# Patient Record
Sex: Female | Born: 1995 | Race: Asian | Hispanic: No | Marital: Married | Smoking: Never smoker
Health system: Southern US, Community
[De-identification: ages and names within clinical notes are randomized; demographics above are authoritative.]

## PROBLEM LIST (undated history)

## (undated) ENCOUNTER — Inpatient Hospital Stay (HOSPITAL_COMMUNITY): Payer: Self-pay

---

## 2017-01-10 ENCOUNTER — Ambulatory Visit (INDEPENDENT_AMBULATORY_CARE_PROVIDER_SITE_OTHER): Payer: Self-pay | Admitting: General Practice

## 2017-01-10 ENCOUNTER — Encounter: Payer: Self-pay | Admitting: General Practice

## 2017-01-10 DIAGNOSIS — Z3201 Encounter for pregnancy test, result positive: Secondary | ICD-10-CM

## 2017-01-10 LAB — POCT PREGNANCY, URINE: PREG TEST UR: POSITIVE — AB

## 2017-01-10 NOTE — Progress Notes (Signed)
Patient here for UPT today. UPT +. Patient reports first positive home test June 2018. LMP 10/31/16 EDD 08/07/17 10.1 weeks. Patient informed. Patient states she only takes prenatal vitamins. Encouraged patient to start care. Patient had no questions

## 2017-01-31 ENCOUNTER — Encounter: Payer: Self-pay | Admitting: Obstetrics

## 2017-02-01 ENCOUNTER — Encounter: Payer: Self-pay | Admitting: Certified Nurse Midwife

## 2017-02-01 ENCOUNTER — Ambulatory Visit (INDEPENDENT_AMBULATORY_CARE_PROVIDER_SITE_OTHER): Payer: Medicaid Other | Admitting: Certified Nurse Midwife

## 2017-02-01 ENCOUNTER — Other Ambulatory Visit (HOSPITAL_COMMUNITY)
Admission: RE | Admit: 2017-02-01 | Discharge: 2017-02-01 | Disposition: A | Discharge status: Home / Self Care | Payer: Medicaid Other | Source: Ambulatory Visit | Attending: Certified Nurse Midwife | Admitting: Certified Nurse Midwife

## 2017-02-01 VITALS — BP 114/70 | HR 80 | Ht 61.0 in | Wt 125.8 lb

## 2017-02-01 DIAGNOSIS — Z3400 Encounter for supervision of normal first pregnancy, unspecified trimester: Secondary | ICD-10-CM | POA: Insufficient documentation

## 2017-02-01 DIAGNOSIS — Z3402 Encounter for supervision of normal first pregnancy, second trimester: Secondary | ICD-10-CM | POA: Diagnosis not present

## 2017-02-01 NOTE — Progress Notes (Signed)
Subjective:    Brandi Jennings is being seen today for her first obstetrical visit.  This is a planned pregnancy. She is at 3758w3d gestation. Her obstetrical history is significant for from [place]. Relationship with FOB: spouse, living together. Patient does intend to breast feed. Pregnancy history fully reviewed.  The information documented in the HPI was reviewed and verified.  Menstrual History: OB History    Gravida Para Term Preterm AB Living   1             SAB TAB Ectopic Multiple Live Births                   Patient's last menstrual period was 11/06/2016.    History reviewed. No pertinent past medical history.  History reviewed. No pertinent surgical history.   (Not in a hospital admission) Allergies  Allergen Reactions  . Shrimp [Shellfish Allergy] Itching and Rash    Social History  Substance Use Topics  . Smoking status: Never Smoker  . Smokeless tobacco: Never Used  . Alcohol use No    Family History  Problem Relation Age of Onset  . Hypertension Mother      Review of Systems Constitutional: negative for weight loss Gastrointestinal: negative for vomiting Genitourinary:negative for genital lesions and vaginal discharge and dysuria Musculoskeletal:negative for back pain Behavioral/Psych: negative for abusive relationship, depression, illegal drug usage and tobacco use    Objective:    BP 114/70   Pulse 80   Ht 5\' 1"  (1.549 m)   Wt 125 lb 12.8 oz (57.1 kg)   LMP 11/06/2016   BMI 23.77 kg/m  General Appearance:    Alert, cooperative, no distress, appears stated age  Head:    Normocephalic, without obvious abnormality, atraumatic  Eyes:    PERRL, conjunctiva/corneas clear, EOM's intact, fundi    benign, both eyes  Ears:    Normal TM's and external ear canals, both ears  Nose:   Nares normal, septum midline, mucosa normal, no drainage    or sinus tenderness  Throat:   Lips, mucosa, and tongue normal; teeth and gums normal  Neck:   Supple, symmetrical,  trachea midline, no adenopathy;    thyroid:  no enlargement/tenderness/nodules; no carotid   bruit or JVD  Back:     Symmetric, no curvature, ROM normal, no CVA tenderness  Lungs:     Clear to auscultation bilaterally, respirations unlabored  Chest Wall:    No tenderness or deformity   Heart:    Regular rate and rhythm, S1 and S2 normal, no murmur, rub   or gallop  Breast Exam:    No tenderness, masses, or nipple abnormality  Abdomen:     Soft, non-tender, bowel sounds active all four quadrants,    no masses, no organomegaly  Genitalia:    Normal female without lesion, discharge or tenderness  Extremities:   Extremities normal, atraumatic, no cyanosis or edema  Pulses:   2+ and symmetric all extremities  Skin:   Skin color, texture, turgor normal, no rashes or lesions  Lymph nodes:   Cervical, supraclavicular, and axillary nodes normal  Neurologic:   CNII-XII intact, normal strength, sensation and reflexes    throughout     Cervix: friable on exam.  Long, thick, closed anterior.  Size c/w dates. FHR:   148 by doppler.     Lab Review Urine pregnancy test Labs reviewed no Radiologic studies reviewed no  Assessment & Plan    Pregnancy at 2458w3d weeks  1. Supervision of normal first pregnancy, antepartum     - Cytology - PAP - Cervicovaginal ancillary only - HIV antibody - Hemoglobinopathy evaluation - Varicella zoster antibody, IgG - VITAMIN D 25 Hydroxy (Vit-D Deficiency, Fractures) - Culture, OB Urine - Prenatal Profile I - Hemoglobin A1c - MaterniT21 PLUS Core+SCA - [place] MFM OB COMP + 14 WK; Future - Inheritest Society Guided     Prenatal vitamins.  Counseling provided regarding continued use of seat belts, cessation of alcohol consumption, smoking or use of illicit drugs; infection precautions i.e., influenza/TDAP immunizations, toxoplasmosis,CMV, parvovirus, listeria and varicella; workplace safety, exercise during pregnancy; routine dental care, safe medications,  sexual activity, hot tubs, saunas, pools, travel, caffeine use, fish and methlymercury, potential toxins, hair treatments, varicose veins Weight gain recommendations per IOM guidelines reviewed: underweight/BMI< 18.5--> gain 28 - 40 lbs; normal weight/BMI 18.5 - 24.9--> gain 25 - 35 lbs; overweight/BMI 25 - 29.9--> gain 15 - 25 lbs; obese/BMI >30->gain  11 - 20 lbs Problem list reviewed and updated. FIRST/CF mutation testing/NIPT/QUAD SCREEN/fragile X/Ashkenazi Jewish population testing/Spinal muscular atrophy discussed: ordered. Role of ultrasound in pregnancy discussed; fetal survey: ordered. Amniocentesis discussed: not indicated.  Meds ordered this encounter  Medications  . Prenatal Vit-Fe Fumarate-FA (MULTIVITAMIN-PRENATAL) 27-0.8 MG TABS tablet    Sig: Take 1 tablet by mouth daily at 12 noon.   Orders Placed This Encounter  Procedures  . Culture, OB Urine  . [place] MFM OB COMP + 14 WK    Standing Status:   Future    Standing Expiration Date:   04/03/2018    Order Specific Question:   Reason for Exam (SYMPTOM  OR DIAGNOSIS REQUIRED)    Answer:   fetal anatomy scan    Order Specific Question:   Preferred imaging location?    Answer:   [facility]Ul[facility]ound  . HIV antibody  . Hemoglobinopathy evaluation  . Varicella zoster antibody, IgG  . VITAMIN D 25 Hydroxy (Vit-D Deficiency, Fractures)  . Prenatal Profile I  . Hemoglobin A1c  . MaterniT21 PLUS Core+SCA    Order Specific Question:   Is the patient insulin dependent?    Answer:   No    Order Specific Question:   Please enter gestational age. This should be expressed as weeks AND days, i.e. 16w 6d. Enter weeks here. Enter days in next question.    Answer:   72    Order Specific Question:   Please enter gestational age. This should be expressed as weeks AND days, i.e. 16w 6d. Enter days here. Enter weeks in previous question.    Answer:   3    Order Specific Question:   How was gestational age calculated?    Answer:   LMP    Order  Specific Question:   Please give the date of LMP OR Ultrasound OR Estimated date of delivery.    Answer:   08/13/2017    Order Specific Question:   Number of Fetuses (Type of Pregnancy):    Answer:   1    Order Specific Question:   Indications for performing the test? (please choose all that apply):    Answer:   Routine screening    Order Specific Question:   Other Indications? (Y=Yes, N=No)    Answer:   N    Order Specific Question:   If this is a repeat specimen, please indicate the reason:    Answer:   Not indicated    Order Specific Question:   Please specify the patient's race: (C=White/Caucasion,  B=Black, I=Native American, A=Asian, H=Hispanic, O=Other, U=Unknown)    Answer:   A    Order Specific Question:   Donor Egg - indicate if the egg was obtained from in vitro fertilization.    Answer:   N    Order Specific Question:   Age of Egg Donor.    Answer:   35    Order Specific Question:   Prior Down Syndrome/ONTD screening during current pregnancy.    Answer:   N    Order Specific Question:   Prior First Trimester Testing    Answer:   N    Order Specific Question:   Prior Second Trimester Testing    Answer:   N    Order Specific Question:   Family History of Neural Tube Defects    Answer:   N    Order Specific Question:   Prior Pregnancy with Down Syndrome    Answer:   N    Order Specific Question:   Please give the patient's weight (in pounds)    Answer:   126  . Inheritest Society Guided    Follow up in 4 weeks. 50% of 30 min visit spent on counseling and coordination of care.

## 2017-02-03 LAB — CULTURE, OB URINE

## 2017-02-03 LAB — URINE CULTURE, OB REFLEX

## 2017-02-05 LAB — CERVICOVAGINAL ANCILLARY ONLY
BACTERIAL VAGINITIS: NEGATIVE
CANDIDA VAGINITIS: NEGATIVE
CHLAMYDIA, DNA PROBE: NEGATIVE
Neisseria Gonorrhea: NEGATIVE
TRICH (WINDOWPATH): NEGATIVE

## 2017-02-05 LAB — CYTOLOGY - PAP: Diagnosis: NEGATIVE

## 2017-02-05 MED ORDER — PROVIDA DHA 16-16-1.25-110 MG PO CAPS: 1.0000 | ORAL_CAPSULE | Freq: Every day | ORAL | 12 refills | Status: DC

## 2017-02-05 NOTE — Addendum Note (Signed)
Addended by: Orvilla CornwallENNEY, RACHELLE A on: 02/05/2017 08:53 AM   Modules accepted: Orders

## 2017-02-06 LAB — HEMOGLOBIN A1C
ESTIMATED AVERAGE GLUCOSE: 100 mg/dL
Hgb A1c MFr Bld: 5.1 % (ref 4.8–5.6)

## 2017-02-06 LAB — PRENATAL PROFILE I(LABCORP)
Antibody Screen: NEGATIVE
BASOS ABS: 0 10*3/uL (ref 0.0–0.2)
Basos: 0 %
EOS (ABSOLUTE): 0.1 10*3/uL (ref 0.0–0.4)
EOS: 1 %
HEP B S AG: NEGATIVE
Hematocrit: 34.8 % (ref 34.0–46.6)
Hemoglobin: 11.7 g/dL (ref 11.1–15.9)
IMMATURE GRANS (ABS): 0 10*3/uL (ref 0.0–0.1)
IMMATURE GRANULOCYTES: 0 %
Lymphocytes Absolute: 2.8 10*3/uL (ref 0.7–3.1)
Lymphs: 24 %
MCH: 29.2 pg (ref 26.6–33.0)
MCHC: 33.6 g/dL (ref 31.5–35.7)
MCV: 87 fL (ref 79–97)
MONOCYTES: 6 %
Monocytes Absolute: 0.7 10*3/uL (ref 0.1–0.9)
NEUTROS PCT: 69 %
Neutrophils Absolute: 8.1 10*3/uL — ABNORMAL HIGH (ref 1.4–7.0)
PLATELETS: 224 10*3/uL (ref 150–379)
RBC: 4.01 x10E6/uL (ref 3.77–5.28)
RDW: 14 % (ref 12.3–15.4)
RH TYPE: POSITIVE
RPR: NONREACTIVE
RUBELLA: 7.86 {index} (ref >0.99)
WBC: 11.7 10*3/uL — ABNORMAL HIGH (ref 3.4–10.8)

## 2017-02-06 LAB — HEMOGLOBINOPATHY EVALUATION
HGB C: 0 %
HGB S: 0 %
HGB VARIANT: 0 %
Hemoglobin A2 Quantitation: 3 % (ref 1.8–3.2)
Hemoglobin F Quantitation: 0 % (ref 0.0–2.0)
Hgb A: 97 % (ref 96.4–98.8)

## 2017-02-06 LAB — VARICELLA ZOSTER ANTIBODY, IGG: VARICELLA: 1232 {index} (ref >165)

## 2017-02-06 LAB — VITAMIN D 25 HYDROXY (VIT D DEFICIENCY, FRACTURES): VIT D 25 HYDROXY: 12 ng/mL — AB (ref 30.0–100.0)

## 2017-02-06 LAB — HIV ANTIBODY (ROUTINE TESTING W REFLEX): HIV SCREEN 4TH GENERATION: NONREACTIVE

## 2017-02-07 ENCOUNTER — Other Ambulatory Visit: Payer: Self-pay | Admitting: Certified Nurse Midwife

## 2017-02-07 DIAGNOSIS — Z3400 Encounter for supervision of normal first pregnancy, unspecified trimester: Secondary | ICD-10-CM

## 2017-02-07 DIAGNOSIS — E559 Vitamin D deficiency, unspecified: Secondary | ICD-10-CM | POA: Insufficient documentation

## 2017-02-07 MED ORDER — VITAMIN D (ERGOCALCIFEROL) 1.25 MG (50000 UNIT) PO CAPS: 50000.0000 [IU] | ORAL_CAPSULE | ORAL | 2 refills | Status: AC

## 2017-02-08 ENCOUNTER — Other Ambulatory Visit: Payer: Self-pay | Admitting: Certified Nurse Midwife

## 2017-02-08 DIAGNOSIS — Z3400 Encounter for supervision of normal first pregnancy, unspecified trimester: Secondary | ICD-10-CM

## 2017-02-08 LAB — MATERNIT21 PLUS CORE+SCA
Chromosome 13: NEGATIVE
Chromosome 18: NEGATIVE
Chromosome 21: NEGATIVE
Y Chromosome: DETECTED

## 2017-02-13 ENCOUNTER — Encounter: Payer: Self-pay | Admitting: Family Medicine

## 2017-02-19 LAB — INHERITEST SOCIETY GUIDED

## 2017-02-20 ENCOUNTER — Other Ambulatory Visit: Payer: Self-pay | Admitting: Certified Nurse Midwife

## 2017-02-20 DIAGNOSIS — Z3400 Encounter for supervision of normal first pregnancy, unspecified trimester: Secondary | ICD-10-CM

## 2017-04-03 ENCOUNTER — Ambulatory Visit (HOSPITAL_COMMUNITY)
Admission: RE | Admit: 2017-04-03 | Discharge: 2017-04-03 | Disposition: A | Discharge status: Home / Self Care | Payer: Medicaid Other | Source: Ambulatory Visit | Attending: Certified Nurse Midwife | Admitting: Certified Nurse Midwife

## 2017-04-03 ENCOUNTER — Other Ambulatory Visit: Payer: Self-pay | Admitting: Certified Nurse Midwife

## 2017-04-03 DIAGNOSIS — Z3400 Encounter for supervision of normal first pregnancy, unspecified trimester: Secondary | ICD-10-CM

## 2017-04-03 DIAGNOSIS — Z3689 Encounter for other specified antenatal screening: Secondary | ICD-10-CM | POA: Insufficient documentation

## 2017-04-03 DIAGNOSIS — Z3A21 21 weeks gestation of pregnancy: Secondary | ICD-10-CM | POA: Insufficient documentation

## 2017-04-30 ENCOUNTER — Encounter: Payer: Self-pay | Admitting: Certified Nurse Midwife

## 2017-04-30 ENCOUNTER — Telehealth: Payer: Self-pay | Admitting: Student

## 2017-04-30 ENCOUNTER — Encounter (HOSPITAL_COMMUNITY): Payer: Self-pay | Admitting: *Deleted

## 2017-04-30 ENCOUNTER — Inpatient Hospital Stay (HOSPITAL_COMMUNITY)
Admission: AD | Admit: 2017-04-30 | Discharge: 2017-04-30 | Disposition: A | Discharge status: Home / Self Care | Payer: Medicaid Other | Source: Ambulatory Visit | Attending: Obstetrics & Gynecology | Admitting: Obstetrics & Gynecology

## 2017-04-30 DIAGNOSIS — Z79899 Other long term (current) drug therapy: Secondary | ICD-10-CM | POA: Diagnosis not present

## 2017-04-30 DIAGNOSIS — O9989 Other specified diseases and conditions complicating pregnancy, childbirth and the puerperium: Secondary | ICD-10-CM | POA: Diagnosis not present

## 2017-04-30 DIAGNOSIS — Z3A25 25 weeks gestation of pregnancy: Secondary | ICD-10-CM | POA: Insufficient documentation

## 2017-04-30 DIAGNOSIS — J020 Streptococcal pharyngitis: Secondary | ICD-10-CM

## 2017-04-30 DIAGNOSIS — J029 Acute pharyngitis, unspecified: Secondary | ICD-10-CM | POA: Diagnosis not present

## 2017-04-30 DIAGNOSIS — J069 Acute upper respiratory infection, unspecified: Secondary | ICD-10-CM | POA: Diagnosis not present

## 2017-04-30 DIAGNOSIS — O99512 Diseases of the respiratory system complicating pregnancy, second trimester: Secondary | ICD-10-CM | POA: Diagnosis not present

## 2017-04-30 DIAGNOSIS — R05 Cough: Secondary | ICD-10-CM | POA: Diagnosis present

## 2017-04-30 LAB — URINALYSIS, ROUTINE W REFLEX MICROSCOPIC
Bilirubin Urine: NEGATIVE
GLUCOSE, UA: NEGATIVE mg/dL
Hgb urine dipstick: NEGATIVE
Ketones, ur: NEGATIVE mg/dL
NITRITE: NEGATIVE
PROTEIN: NEGATIVE mg/dL
Specific Gravity, Urine: 1.009 (ref 1.005–1.030)
pH: 8 (ref 5.0–8.0)

## 2017-04-30 LAB — INFLUENZA PANEL BY PCR (TYPE A & B)
INFLBPCR: NEGATIVE
Influenza A By PCR: NEGATIVE

## 2017-04-30 LAB — RAPID STREP SCREEN (MED CTR MEBANE ONLY): STREPTOCOCCUS, GROUP A SCREEN (DIRECT): POSITIVE — AB

## 2017-04-30 MED ORDER — PENICILLIN V POTASSIUM 500 MG PO TABS: 500.0000 mg | ORAL_TABLET | Freq: Two times a day (BID) | ORAL | 0 refills | Status: DC

## 2017-04-30 MED ORDER — CETIRIZINE HCL 10 MG PO TABS: 10.0000 mg | ORAL_TABLET | Freq: Every day | ORAL | 0 refills | Status: DC

## 2017-04-30 MED ORDER — BENZONATATE 100 MG PO CAPS: 100.0000 mg | ORAL_CAPSULE | Freq: Two times a day (BID) | ORAL | 0 refills | Status: DC

## 2017-04-30 NOTE — MAU Note (Signed)
Pt presents to MAU with complaints of cough and sore throat for weeks. Denies any concerns with pregnancy

## 2017-04-30 NOTE — Telephone Encounter (Signed)
Verified by patient name & DOB. Notified of positive strep swab. No medication allergies. Abx sent to pharmacy. Pt to replace toothbrush 2 days after start of abx. All questions answered.   Judeth HornLawrence, Myelle Poteat, NP

## 2017-04-30 NOTE — Discharge Instructions (Signed)
Upper Respiratory Infection, Adult Most upper respiratory infections (URIs) are caused by a virus. A URI affects the nose, throat, and upper air passages. The most common type of URI is often called "the common cold." Follow these instructions at home:  Take medicines only as told by your doctor.  Gargle warm saltwater or take cough drops to comfort your throat as told by your doctor.  Use a warm mist humidifier or inhale steam from a shower to increase air moisture. This may make it easier to breathe.  Drink enough fluid to keep your pee (urine) clear or pale yellow.  Eat soups and other clear broths.  Have a healthy diet.  Rest as needed.  Go back to work when your fever is gone or your doctor says it is okay. ? You may need to stay home longer to avoid giving your URI to others. ? You can also wear a face mask and wash your hands often to prevent spread of the virus.  Use your inhaler more if you have asthma.  Do not use any tobacco products, including cigarettes, chewing tobacco, or electronic cigarettes. If you need help quitting, ask your doctor. Contact a doctor if:  You are getting worse, not better.  Your symptoms are not helped by medicine.  You have chills.  You are getting more short of breath.  You have brown or red mucus.  You have yellow or brown discharge from your nose.  You have pain in your face, especially when you bend forward.  You have a fever.  You have puffy (swollen) neck glands.  You have pain while swallowing.  You have white areas in the back of your throat. Get help right away if:  You have very bad or constant: ? Headache. ? Ear pain. ? Pain in your forehead, behind your eyes, and over your cheekbones (sinus pain). ? Chest pain.  You have long-lasting (chronic) lung disease and any of the following: ? Wheezing. ? Long-lasting cough. ? Coughing up blood. ? A change in your usual mucus.  You have a stiff neck.  You have  changes in your: ? Vision. ? Hearing. ? Thinking. ? Mood. This information is not intended to replace advice given to you by your health care provider. Make sure you discuss any questions you have with your health care provider. Document Released: 11/29/2007 Document Revised: 02/13/2016 Document Reviewed: 09/17/2013 Elsevier Interactive Patient Education  2018 [employer]     Safe Medications in Pregnancy   Acne: Benzoyl Peroxide Salicylic Acid  Backache/Headache: Tylenol: 2 regular strength every 4 hours OR              2 Extra strength every 6 hours  Colds/Coughs/Allergies: Benadryl (alcohol free) 25 mg every 6 hours as needed Breath right strips Claritin Cepacol throat lozenges Chloraseptic throat spray Cold-Eeze- up to three times per day Cough drops, alcohol free Flonase (by prescription only) Guaifenesin Mucinex Robitussin DM (plain only, alcohol free) Saline nasal spray/drops Sudafed (pseudoephedrine) & Actifed ** use only after [redacted] weeks gestation and if you do not have high blood pressure Tylenol Vicks Vaporub Zinc lozenges Zyrtec   Constipation: Colace Ducolax suppositories Fleet enema Glycerin suppositories Metamucil Milk of magnesia Miralax Senokot Smooth move tea  Diarrhea: Kaopectate Imodium A-D  *NO pepto Bismol  Hemorrhoids: Anusol Anusol HC Preparation H Tucks  Indigestion: Tums Maalox Mylanta Zantac  Pepcid  Insomnia: Benadryl (alcohol free) 25mg  every 6 hours as needed Tylenol PM Unisom, no Gelcaps  Leg Cramps:  Tums MagGel  Nausea/Vomiting:  Bonine Dramamine Emetrol Ginger extract Sea bands Meclizine  Nausea medication to take during pregnancy:  Unisom (doxylamine succinate 25 mg tablets) Take one tablet daily at bedtime. If symptoms are not adequately controlled, the dose can be increased to a maximum recommended dose of two tablets daily (1/2 tablet in the morning, 1/2 tablet mid-afternoon and one at  bedtime). Vitamin B6 100mg  tablets. Take one tablet twice a day (up to 200 mg per day).  Skin Rashes: Aveeno products Benadryl cream or 25mg  every 6 hours as needed Calamine Lotion 1% cortisone cream  Yeast infection: Gyne-lotrimin 7 Monistat 7  Gum/tooth pain: Anbesol  **If taking multiple medications, please check labels to avoid duplicating the same active ingredients **take medication as directed on the label ** Do not exceed 4000 mg of tylenol in 24 hours **Do not take medications that contain aspirin or ibuprofen

## 2017-04-30 NOTE — MAU Provider Note (Signed)
History     CSN: [id number]  Arrival date and time: 04/30/17 1503  First Provider Initiated Contact with Patient 04/30/17 1706       Chief Complaint  Patient presents with  . Cough  . Sore Throat   HPI Brandi Jennings is a 21 y.o. G1P0 at [redacted]w[redacted]d who presents with cough & sore throat. Symptoms began last week. Initially had itchy throat that has progressed to throat pain/burning. Rates pain 6/10. Has not treated symptoms. Nothing makes better or worse. Endorses non productive cough that is worse at night. Denies headache, ear pain, fever/chills, sick contacts, SOB, or CP. Denies abdominal pain, or vaginal bleeding. No ob complaints today. Hasn't been to any OB appts since initial OB visit b/c she hadn't heard from the office & didn't know that she needed to call them to schedule appointment.  Positive fetal movement.   OB History    Gravida Para Term Preterm AB Living   1             SAB TAB Ectopic Multiple Live Births                  No past medical history on file.  No past surgical history on file.  Family History  Problem Relation Age of Onset  . Hypertension Mother     Social History   Tobacco Use  . Smoking status: Never Smoker  . Smokeless tobacco: Never Used  Substance Use Topics  . Alcohol use: No  . Drug use: No    Allergies:  Allergies  Allergen Reactions  . Shrimp [Shellfish Allergy] Itching and Rash    Medications Prior to Admission  Medication Sig Dispense Refill Last Dose  . Prenat-FeFum-FePo-FA-DHA w/o A (PROVIDA DHA) 16-16-1.25-110 MG CAPS Take 1 tablet by mouth daily. 30 capsule 12   . Prenatal Vit-Fe Fumarate-FA (MULTIVITAMIN-PRENATAL) 27-0.8 MG TABS tablet Take 1 tablet by mouth daily at 12 noon.   Taking  . Vitamin D, Ergocalciferol, (DRISDOL) 50000 units CAPS capsule Take 1 capsule (50,000 Units total) by mouth every 7 (seven) days. 30 capsule 2     Review of Systems  Constitutional: Negative for chills and fever.  HENT: Positive for  rhinorrhea and sore throat. Negative for congestion, ear pain, sinus pain, sneezing and trouble swallowing.   Respiratory: Positive for cough. Negative for shortness of breath and wheezing.   Cardiovascular: Negative.   Gastrointestinal: Negative.   Genitourinary: Negative.   Musculoskeletal: Negative for myalgias.   Physical Exam   Blood pressure 109/67, pulse 90, temperature 98.8 F (37.1 C), temperature source Oral, resp. rate 16, weight 144 lb (65.3 kg), last menstrual period 11/06/2016.  Physical Exam  Nursing note and vitals reviewed. Constitutional: She is oriented to person, place, and time. She appears well-developed and well-nourished. No distress.  HENT:  Head: Normocephalic and atraumatic.  Right Ear: Tympanic membrane normal.  Left Ear: Tympanic membrane normal.  Nose: Mucosal edema and rhinorrhea present. Right sinus exhibits no maxillary sinus tenderness and no frontal sinus tenderness. Left sinus exhibits no maxillary sinus tenderness and no frontal sinus tenderness.  Mouth/Throat: Mucous membranes are normal. Posterior oropharyngeal erythema (mild erythema & PND noted) present. No oropharyngeal exudate, posterior oropharyngeal edema or tonsillar abscesses.  Eyes: Conjunctivae are normal. Right eye exhibits no discharge. Left eye exhibits no discharge. No scleral icterus.  Neck: Normal range of motion.  Cardiovascular: Normal rate, regular rhythm and normal heart sounds.  No murmur heard. Respiratory: Effort normal and breath sounds normal.  No respiratory distress. She has no wheezes.  Lymphadenopathy:       Head (right side): No submental, no submandibular and no tonsillar adenopathy present.       Head (left side): No submental, no submandibular and no tonsillar adenopathy present.    She has no cervical adenopathy.  Neurological: She is alert and oriented to person, place, and time.  Skin: Skin is warm and dry. She is not diaphoretic.  Psychiatric: She has a normal  mood and affect. Her behavior is normal. Judgment and thought content normal.   Fetal Tracing:  Baseline: 140 Variability: moderate Accelerations: none Decelerations: none  Toco: none MAU Course  Procedures Results for orders placed or performed during the hospital encounter of 04/30/17 (from the past 24 hour(s))  Urinalysis, Routine w reflex microscopic     Status: Abnormal   Collection Time: 04/30/17  1:52 PM  Result Value Ref Range   Color, Urine STRAW (A) YELLOW   APPearance CLEAR CLEAR   Specific Gravity, Urine 1.009 1.005 - 1.030   pH 8.0 5.0 - 8.0   Glucose, UA NEGATIVE NEGATIVE mg/dL   Hgb urine dipstick NEGATIVE NEGATIVE   Bilirubin Urine NEGATIVE NEGATIVE   Ketones, ur NEGATIVE NEGATIVE mg/dL   Protein, ur NEGATIVE NEGATIVE mg/dL   Nitrite NEGATIVE NEGATIVE   Leukocytes, UA MODERATE (A) NEGATIVE   RBC / HPF 0-5 0 - 5 RBC/hpf   WBC, UA 6-30 0 - 5 WBC/hpf   Bacteria, UA RARE (A) NONE SEEN   Squamous Epithelial / LPF 0-5 (A) NONE SEEN   Mucus PRESENT     MDM Fetal tracing appropriate for gestation VSS, pt afebrile Flu & strep swabs collected & pending  Assessment and Plan  A: 1. Upper respiratory tract infection, unspecified type   2. [redacted] weeks gestation of pregnancy    P: Discharge home Rx zyrtec & tessalon Discussed reasons to return to MAU Schedule OB appt asap Flu & strep swabs pending  Brandi Jennings 04/30/2017, 5:06 PM

## 2017-05-02 ENCOUNTER — Encounter: Payer: Medicaid Other | Admitting: Certified Nurse Midwife

## 2017-05-03 ENCOUNTER — Telehealth: Payer: Self-pay | Admitting: *Deleted

## 2017-05-03 NOTE — Telephone Encounter (Signed)
Rescheduled pt and left her a message with the appt time and date and asked her to callback if that time and date did not work for her.Marland Kitchen..Marland Kitchen

## 2017-05-07 ENCOUNTER — Ambulatory Visit (INDEPENDENT_AMBULATORY_CARE_PROVIDER_SITE_OTHER): Payer: Medicaid Other | Admitting: Certified Nurse Midwife

## 2017-05-07 ENCOUNTER — Other Ambulatory Visit: Payer: Self-pay

## 2017-05-07 VITALS — BP 108/64 | HR 89 | Wt 144.3 lb

## 2017-05-07 DIAGNOSIS — Z3402 Encounter for supervision of normal first pregnancy, second trimester: Secondary | ICD-10-CM

## 2017-05-07 DIAGNOSIS — Z3400 Encounter for supervision of normal first pregnancy, unspecified trimester: Secondary | ICD-10-CM

## 2017-05-07 DIAGNOSIS — Z23 Encounter for immunization: Secondary | ICD-10-CM

## 2017-05-07 DIAGNOSIS — E559 Vitamin D deficiency, unspecified: Secondary | ICD-10-CM

## 2017-05-07 NOTE — Progress Notes (Signed)
FLU given in left deltoid. Tolerated well.

## 2017-05-07 NOTE — Patient Instructions (Addendum)
AREA PEDIATRIC/FAMILY PRACTICE PHYSICIANS  [facility] [address], [address] [place], [place]  [address] Phone - [phone]   Fax - [phone]  [facility] [address] [place], [place] [address] Phone - [phone]   Fax - [phone]  JACK AMOS [address] [place], [place]  [address] Phone - [phone]   Fax - [phone]  [facility] [address] [place], [place]  [address] Phone - [phone]   Fax - [phone]  [facility] [address] [place], [place]  [address] Phone - [phone]   Fax - [phone]  [facility] [address], [address] [place], [place]  [address] Phone - [phone]   Fax - [phone]  [facility] [address] [place], [place]  [address] Phone - [phone]   Fax - [phone]  [facility] [address] [place], [place]  [address] Phone - [phone]   Fax - [phone]  [facility] [address] [place], [place]  [address] Phone - [phone]   Fax - [phone] [facility] [address] [place], [place]  [address] Phone - [phone]   Fax - [phone]  [facility] [address] [place], [place]  [address] Phone - [phone]   Fax - [phone]  [facility] [address] [place], [place]  [address] Phone - [phone]   Fax - [phone]  [facility] [address], [address] [place], [place]  [address] Phone - [phone]   Fax - [phone]  SHILPA GOSRANI [address] [place], [place]  [address] Phone - [phone]  [facility] [address] [place], [place]  [address] Phone - [phone]   Fax - [phone]  [facility] [address] [place], [place]  [address] Phone - [phone]   Fax - [phone]  [facility] [address] [place], [place]  [address] Phone - [phone]   Fax - [phone]  [facility] [address] [place], [place]  [address] Phone - [phone]   Fax - [phone]   [facility] [address] [place], [place]  [address] Phone - [phone]   Fax - [phone]  [facility] [address] [place], [place]  [address] Phone - [phone]   Fax - [phone]  DAVID RUBIN [address] [place], [place]  [address] Phone - [phone]   Fax - [phone]  [facility] [address] [place], [place]  [address] Phone - [phone]   Fax - [phone]  [facility] [address] [place], [place]  [address] Phone - [phone]   Fax - [phone] [facility] [address] [place], [place]  [address] Phone - [phone]   Fax - [phone]  [facility] [address] [place], [place]  [address] Phone - [phone]   Fax - [phone]  [facility] [address] [place],   [address] Phone - [phone]   Fax - [phone]  [facility] [place] [facility] Charlene Flemming MD [address] [place]  [address] Phone [phone]  Fax [phone]  Contraception Choices Contraception (birth control) is the use of any methods or devices to prevent   pregnancy. Below are some methods to help avoid pregnancy. Hormonal methods  Contraceptive implant. This is a thin, plastic tube containing progesterone hormone. It does not contain estrogen hormone. Your health care provider inserts the tube in the inner part of the upper arm. The tube can remain in place for up to 3 years. After 3 years, the implant must be removed. The implant prevents the  ovaries from releasing an egg (ovulation), thickens the cervical mucus to prevent sperm from entering the uterus, and thins the lining of the inside of the uterus.  Progesterone-only injections. These injections are given every 3 months by your health care provider to prevent pregnancy. This synthetic progesterone hormone stops the ovaries from releasing eggs. It also thickens cervical mucus and changes the uterine lining. This makes it harder for sperm to survive in the uterus.  Birth control pills. These pills contain estrogen and progesterone hormone. They work by preventing the ovaries from releasing eggs (ovulation). They also cause the cervical mucus to thicken, preventing the sperm from entering the uterus. Birth control pills are prescribed by a health care provider.Birth control pills can also be used to treat heavy periods.  Minipill. This type of birth control pill contains only the progesterone hormone. They are taken every day of each month and must be prescribed by your health care provider.  Birth control patch. The patch contains hormones similar to those in birth control pills. It must be changed once a week and is prescribed by a health care provider.  Vaginal ring. The ring contains hormones similar to those in birth control pills. It is left in the vagina for 3 weeks, removed for 1 week, and then a new one is put back in place. The patient must be comfortable inserting and removing the ring from the vagina.A health care provider's prescription is necessary.  Emergency contraception. Emergency contraceptives prevent pregnancy after unprotected sexual intercourse. This pill can be taken right after sex or up to 5 days after unprotected sex. It is most effective the sooner you take the pills after having sexual intercourse. Most emergency contraceptive pills are available without a prescription. Check with your pharmacist. Do not use emergency contraception as your only form of birth  control. Barrier methods  Female condom. This is a thin sheath (latex or rubber) that is worn over the penis during sexual intercourse. It can be used with spermicide to increase effectiveness.  Female condom. This is a soft, loose-fitting sheath that is put into the vagina before sexual intercourse.  Diaphragm. This is a soft, latex, dome-shaped barrier that must be fitted by a health care provider. It is inserted into the vagina, along with a spermicidal jelly. It is inserted before intercourse. The diaphragm should be left in the vagina for 6 to 8 hours after intercourse.  Cervical cap. This is a round, soft, latex or plastic cup that fits over the cervix and must be fitted by a health care provider. The cap can be left in place for up to 48 hours after intercourse.  Sponge. This is a soft, circular piece of polyurethane foam. The sponge has spermicide in it. It is inserted into the vagina after wetting it and before sexual intercourse.  Spermicides. These are chemicals that kill or block sperm from entering the cervix and uterus. They come in the form of creams, jellies, suppositories, foam, or tablets. They do not require a prescription. They are inserted into the vagina with an applicator before having sexual intercourse. The process   must be repeated every time you have sexual intercourse. Intrauterine contraception  Intrauterine device (IUD). This is a T-shaped device that is put in a woman's uterus during a menstrual period to prevent pregnancy. There are 2 types: ? Copper IUD. This type of IUD is wrapped in copper wire and is placed inside the uterus. Copper makes the uterus and fallopian tubes produce a fluid that kills sperm. It can stay in place for 10 years. ? Hormone IUD. This type of IUD contains the hormone progestin (synthetic progesterone). The hormone thickens the cervical mucus and prevents sperm from entering the uterus, and it also thins the uterine lining to prevent  implantation of a fertilized egg. The hormone can weaken or kill the sperm that get into the uterus. It can stay in place for 3-5 years, depending on which type of IUD is used. Permanent methods of contraception  Female tubal ligation. This is when the woman's fallopian tubes are surgically sealed, tied, or blocked to prevent the egg from traveling to the uterus.  Hysteroscopic sterilization. This involves placing a small coil or insert into each fallopian tube. Your doctor uses a technique called hysteroscopy to do the procedure. The device causes scar tissue to form. This results in permanent blockage of the fallopian tubes, so the sperm cannot fertilize the egg. It takes about 3 months after the procedure for the tubes to become blocked. You must use another form of birth control for these 3 months.  Female sterilization. This is when the female has the tubes that carry sperm tied off (vasectomy).This blocks sperm from entering the vagina during sexual intercourse. After the procedure, the man can still ejaculate fluid (semen). Natural planning methods  Natural family planning. This is not having sexual intercourse or using a barrier method (condom, diaphragm, cervical cap) on days the woman could become pregnant.  Calendar method. This is keeping track of the length of each menstrual cycle and identifying when you are fertile.  Ovulation method. This is avoiding sexual intercourse during ovulation.  Symptothermal method. This is avoiding sexual intercourse during ovulation, using a thermometer and ovulation symptoms.  Post-ovulation method. This is timing sexual intercourse after you have ovulated. Regardless of which type or method of contraception you choose, it is important that you use condoms to protect against the transmission of sexually transmitted infections (STIs). Talk with your health care provider about which form of contraception is most appropriate for you. This information is not  intended to replace advice given to you by your health care provider. Make sure you discuss any questions you have with your health care provider. Document Released: 06/12/2005 Document Revised: 11/18/2015 Document Reviewed: 12/05/2012 Elsevier Interactive Patient Education  2017 Elsevier Inc.  

## 2017-05-07 NOTE — Progress Notes (Signed)
   PRENATAL VISIT NOTE  Subjective:  Brandi Jennings is a 21 y.o. G1P0 at 3029w0d being seen today for ongoing prenatal care.  She is currently monitored for the following issues for this low-risk pregnancy and has Supervision of normal first pregnancy, antepartum and Vitamin D deficiency on their problem list.  Patient reports no complaints.  Contractions: Irregular. Vag. Bleeding: None.  Movement: Present. Denies leaking of fluid.   The following portions of the patient's history were reviewed and updated as appropriate: allergies, current medications, past family history, past medical history, past social history, past surgical history and problem list. Problem list updated.  Objective:   Vitals:   05/07/17 1411  BP: 108/64  Pulse: 89  Weight: 144 lb 4.8 oz (65.5 kg)    Fetal Status: Fetal Heart Rate (bpm): 142; doppler Fundal Height: 26 cm Movement: Present     General:  Alert, oriented and cooperative. Patient is in no acute distress.  Skin: Skin is warm and dry. No rash noted.   Cardiovascular: Normal heart rate noted  Respiratory: Normal respiratory effort, no problems with respiration noted  Abdomen: Soft, gravid, appropriate for gestational age.  Pain/Pressure: Absent     Pelvic: Cervical exam deferred        Extremities: Normal range of motion.  Edema: None  Mental Status:  Normal mood and affect. Normal behavior. Normal judgment and thought content.   Assessment and Plan:  Pregnancy: G1P0 at 3329w0d  1. Supervision of normal first pregnancy, antepartum     Doing well - Flu Vaccine QUAD 36+ mos IM (Fluarix, Quad PF)  2. Vitamin D deficiency     Taking weekly vitamin D.  Preterm labor symptoms and general obstetric precautions including but not limited to vaginal bleeding, contractions, leaking of fluid and fetal movement were reviewed in detail with the patient. Please refer to After Visit Summary for other counseling recommendations.  Return in about 2 weeks (around  05/21/2017) for ROB, 2 hr OGTT.   Roe Coombsachelle A Joyell Emami, CNM

## 2017-05-28 ENCOUNTER — Other Ambulatory Visit: Payer: Medicaid Other

## 2017-05-28 ENCOUNTER — Ambulatory Visit (INDEPENDENT_AMBULATORY_CARE_PROVIDER_SITE_OTHER): Payer: Medicaid Other | Admitting: Certified Nurse Midwife

## 2017-05-28 ENCOUNTER — Encounter: Payer: Self-pay | Admitting: Certified Nurse Midwife

## 2017-05-28 VITALS — BP 109/72 | HR 97 | Wt 148.6 lb

## 2017-05-28 DIAGNOSIS — Z3400 Encounter for supervision of normal first pregnancy, unspecified trimester: Secondary | ICD-10-CM

## 2017-05-28 DIAGNOSIS — Z23 Encounter for immunization: Secondary | ICD-10-CM | POA: Diagnosis not present

## 2017-05-28 DIAGNOSIS — E559 Vitamin D deficiency, unspecified: Secondary | ICD-10-CM

## 2017-05-28 DIAGNOSIS — Z3403 Encounter for supervision of normal first pregnancy, third trimester: Secondary | ICD-10-CM

## 2017-05-28 NOTE — Patient Instructions (Signed)
AREA PEDIATRIC/FAMILY PRACTICE PHYSICIANS  [facility] [address] [place], [place]  [address] Phone - [phone]   Fax - [phone]  [facility] [address] [place], [place] [address] Phone - [phone]   Fax - [phone]  JACK AMOS [address] [place], [place]  [address] Phone - [phone]   Fax - [phone]  [facility] [address] [place], [place]  [address] Phone - [phone]   Fax - [phone]  [facility] [address] [place], [place]  [address] Phone - [phone]   Fax - [phone]  [facility] [address] [place], [place]  [address] Phone - [phone]   Fax - [phone]  [facility] [address] [place], [place]  [address] Phone - [phone]   Fax - [phone]  [facility] [address] [place], [place]  [address] Phone - [phone]   Fax - [phone]  [facility] [address] [place], [place]  [address] Phone - [phone]   Fax - [phone] [facility] [address] [place], [place]  [address] Phone - [phone]   Fax - [phone]  [facility] [address] [place], [place]  [address] Phone - [phone]   Fax - [phone]  [facility] [address] [place], [place]  [address] Phone - [phone]   Fax - [phone]  [facility] [address] [place], [place]  [address] Phone - [phone]   Fax - [phone]  SHILPA GOSRANI [address] [place], [place]  [address] Phone - [phone]  [facility] [address] [place], [place]  [address] Phone - [phone]   Fax - [phone]  [facility] [address] [place], [place]  [address] Phone - [phone]   Fax - [phone]  [facility] [address] [place], [place]  [address] Phone - [phone]   Fax - [phone]  [facility] [address] [place], [place]  [address] Phone - [phone]   Fax - [phone]   [facility] [address] [place], [place]   [address] Phone - [phone]   Fax - [phone]  [facility] [address] [place], [place]  [address] Phone - [phone]   Fax - [phone]  DAVID RUBIN [address] [place], [place]  [address] Phone - [phone]   Fax - [phone]  [facility] [address] [place], [place]  [address] Phone - [phone]   Fax - [phone]  [facility] [address] , [place]  [address] Phone - [phone]   Fax - [phone] [facility] [address] [place], [place]  [address] Phone - [phone]   Fax - [phone]  [facility] [address] [place], [place]  [address] Phone - [phone]   Fax - [phone]  [facility] [address] [place], [place]  [address] Phone - [phone]   Fax - [phone]  [facility] Charlene Flemming MD [address]   [address] Phone [phone]  Fax [phone]   

## 2017-05-28 NOTE — Progress Notes (Signed)
Patient reports good fetal movement, denies pain. Pt reprts that she did not know vit D rx was sent, but states that she will call pharmacy.

## 2017-05-28 NOTE — Progress Notes (Signed)
   PRENATAL VISIT NOTE  Subjective:  Brandi Jennings is a 21 y.o. G1P0 at 1691w0d being seen today for ongoing prenatal care.  She is currently monitored for the following issues for this low-risk pregnancy and has Supervision of normal first pregnancy, antepartum and Vitamin D deficiency on their problem list.  Patient reports no complaints.  Contractions: Not present. Vag. Bleeding: None.  Movement: Present. Denies leaking of fluid.   The following portions of the patient's history were reviewed and updated as appropriate: allergies, current medications, past family history, past medical history, past social history, past surgical history and problem list. Problem list updated.  Objective:   Vitals:   05/28/17 0834  BP: 109/72  Pulse: 97  Weight: 67.4 kg (148 lb 9.6 oz)    Fetal Status: Fetal Heart Rate (bpm): 142; doppler Fundal Height: 29 cm Movement: Present     General:  Alert, oriented and cooperative. Patient is in no acute distress.  Skin: Skin is warm and dry. No rash noted.   Cardiovascular: Normal heart rate noted  Respiratory: Normal respiratory effort, no problems with respiration noted  Abdomen: Soft, gravid, appropriate for gestational age.  Pain/Pressure: Absent     Pelvic: Cervical exam deferred        Extremities: Normal range of motion.  Edema: None  Mental Status:  Normal mood and affect. Normal behavior. Normal judgment and thought content.   Assessment and Plan:  Pregnancy: G1P0 at 5691w0d  1. Supervision of normal first pregnancy, antepartum      Doing well, TDaP vaciene today.  - Glucose Tolerance, 2 Hours w/1 Hour - RPR - CBC - HIV antibody (with reflex)  2. Vitamin D deficiency     Taking weekly vitamin D.   Preterm labor symptoms and general obstetric precautions including but not limited to vaginal bleeding, contractions, leaking of fluid and fetal movement were reviewed in detail with the patient. Please refer to After Visit Summary for other counseling  recommendations.  Return in about 2 weeks (around 06/11/2017) for ROB.   Roe Coombsachelle A Destinae Neubecker, CNM

## 2017-05-29 LAB — GLUCOSE TOLERANCE, 2 HOURS W/ 1HR
GLUCOSE, FASTING: 87 mg/dL (ref 65–91)
Glucose, 1 hour: 136 mg/dL (ref 65–179)
Glucose, 2 hour: 121 mg/dL (ref 65–152)

## 2017-05-29 LAB — CBC
HEMATOCRIT: 30.4 % — AB (ref 34.0–46.6)
Hemoglobin: 10 g/dL — ABNORMAL LOW (ref 11.1–15.9)
MCH: 29.4 pg (ref 26.6–33.0)
MCHC: 32.9 g/dL (ref 31.5–35.7)
MCV: 89 fL (ref 79–97)
Platelets: 190 10*3/uL (ref 150–379)
RBC: 3.4 x10E6/uL — ABNORMAL LOW (ref 3.77–5.28)
RDW: 13.7 % (ref 12.3–15.4)
WBC: 12.3 10*3/uL — AB (ref 3.4–10.8)

## 2017-05-29 LAB — HIV ANTIBODY (ROUTINE TESTING W REFLEX): HIV Screen 4th Generation wRfx: NONREACTIVE

## 2017-05-29 LAB — RPR: RPR Ser Ql: NONREACTIVE

## 2017-05-30 ENCOUNTER — Other Ambulatory Visit: Payer: Self-pay | Admitting: Certified Nurse Midwife

## 2017-05-30 DIAGNOSIS — O99013 Anemia complicating pregnancy, third trimester: Secondary | ICD-10-CM

## 2017-05-30 DIAGNOSIS — Z3400 Encounter for supervision of normal first pregnancy, unspecified trimester: Secondary | ICD-10-CM

## 2017-05-30 MED ORDER — CITRANATAL BLOOM 90-1 MG PO TABS: 1.0000 | ORAL_TABLET | Freq: Every day | ORAL | 12 refills | Status: AC

## 2017-06-11 ENCOUNTER — Ambulatory Visit (INDEPENDENT_AMBULATORY_CARE_PROVIDER_SITE_OTHER): Payer: Medicaid Other | Admitting: Certified Nurse Midwife

## 2017-06-11 ENCOUNTER — Encounter: Payer: Self-pay | Admitting: Certified Nurse Midwife

## 2017-06-11 VITALS — BP 107/71 | HR 94 | Wt 149.8 lb

## 2017-06-11 DIAGNOSIS — Z3400 Encounter for supervision of normal first pregnancy, unspecified trimester: Secondary | ICD-10-CM

## 2017-06-11 DIAGNOSIS — E559 Vitamin D deficiency, unspecified: Secondary | ICD-10-CM

## 2017-06-11 DIAGNOSIS — O99013 Anemia complicating pregnancy, third trimester: Secondary | ICD-10-CM

## 2017-06-11 NOTE — Progress Notes (Signed)
Patient reports good fetal movement, denies pain. 

## 2017-06-11 NOTE — Patient Instructions (Addendum)
AREA PEDIATRIC/FAMILY PRACTICE PHYSICIANS  [facility] [address], [address] [place], [place]  [address] Phone - [phone]   Fax - [phone]  [facility] [place] [facility] [address] [place], [place] [address] Phone - [phone]   Fax - [phone]  JACK AMOS [address] [place], [place]  [address] Phone - [phone]   Fax - [phone]  [facility] [address] [place], [place]  [address] Phone - [phone]   Fax - [phone]  [facility] [address] [place], [place]  [address] Phone - [phone]   Fax - [phone]  [facility] [address] [place], [place]  [address] Phone - [phone]   Fax - [phone]  [facility] [place] [address] [place], [place]  [address] Phone - [phone]   Fax - [phone]  [facility] [address] [place], [place]  [address] Phone - [phone]   Fax - [phone]  [facility] [address] [place], [place]  [address] Phone - [phone]   Fax - [phone] [facility] [address] [place], [place]  [address] Phone - [phone]   Fax - [phone]  [facility] [address] [place], [place]  [address] Phone - [phone]   Fax - [phone]  [facility] [address] [place], [place]  [address] Phone - [phone]   Fax - [phone]  [facility] [address] [place], [place]  [address] Phone - [phone]   Fax - [phone]  SHILPA GOSRANI [address] [place], [place]  [address] Phone - [phone]  [facility] [address] [place], [place]  [address] Phone - [phone]   Fax - [phone]  [facility] [address] [place], [place]  [address] Phone - [phone]   Fax - [phone]  [facility] [address] [place], [place]  [address] Phone - [phone]   Fax - [phone]  [facility] [address] [place], [place]  [address] Phone - [phone]   Fax - [phone]   [facility] [address] [place], [place]  [address] Phone - [phone]   Fax - [phone]  [facility] [address] [place], [place]  [address] Phone - [phone]   Fax - [phone]  DAVID RUBIN [address] [place], [place]  [address] Phone - [phone]   Fax - [phone]  [facility] [address] [place], [place]  [address] Phone - [phone]   Fax - [phone]  [facility] [address] [place], [place]  [address] Phone - [phone]   Fax - [phone] [facility] [address] [place], [place]  [address] Phone - [phone]   Fax - [phone]  [facility] [address] [place], [place]  [address] Phone - [phone]   Fax - [phone]   [facility] - [facility] [address] [place], [place]  [address] Phone - [phone]   Fax - [phone]  [facility] [place] Charlene Flemming MD [address] [place]  [address] Phone [phone]  Fax [phone]  Contraception Choices Contraception (birth control) is the use of any methods or devices to prevent   pregnancy. Below are some methods to help avoid pregnancy. Hormonal methods  Contraceptive implant. This is a thin, plastic tube containing progesterone hormone. It does not contain estrogen hormone. Your health care provider inserts the tube in the inner part of the upper arm. The tube can remain in place for up to 3 years. After 3 years, the implant must be removed. The implant prevents the  ovaries from releasing an egg (ovulation), thickens the cervical mucus to prevent sperm from entering the uterus, and thins the lining of the inside of the uterus.  Progesterone-only injections. These injections are given every 3 months by your health care provider to prevent pregnancy. This synthetic progesterone hormone stops the ovaries from releasing eggs. It also thickens cervical mucus and changes the uterine lining. This makes it harder for sperm to survive in the uterus.  Birth control pills. These pills contain estrogen and progesterone hormone. They work by preventing the ovaries from releasing eggs (ovulation). They also cause the cervical mucus to thicken, preventing the sperm from entering the uterus. Birth control pills are prescribed by a health care provider.Birth control pills can also be used to treat heavy periods.  Minipill. This type of birth control pill contains only the progesterone hormone. They are taken every day of each month and must be prescribed by your health care provider.  Birth control patch. The patch contains hormones similar to those in birth control pills. It must be changed once a week and is prescribed by a health care provider.  Vaginal ring. The ring contains hormones similar to those in birth control pills. It is left in the vagina for 3 weeks, removed for 1 week, and then a new one is put back in place. The patient must be comfortable inserting and removing the ring from the vagina.A health care provider's prescription is necessary.  Emergency contraception. Emergency contraceptives prevent pregnancy after unprotected sexual intercourse. This pill can be taken right after sex or up to 5 days after unprotected sex. It is most effective the sooner you take the pills after having sexual intercourse. Most emergency contraceptive pills are available without a prescription. Check with your pharmacist. Do not use emergency contraception as your only form of birth  control. Barrier methods  Female condom. This is a thin sheath (latex or rubber) that is worn over the penis during sexual intercourse. It can be used with spermicide to increase effectiveness.  Female condom. This is a soft, loose-fitting sheath that is put into the vagina before sexual intercourse.  Diaphragm. This is a soft, latex, dome-shaped barrier that must be fitted by a health care provider. It is inserted into the vagina, along with a spermicidal jelly. It is inserted before intercourse. The diaphragm should be left in the vagina for 6 to 8 hours after intercourse.  Cervical cap. This is a round, soft, latex or plastic cup that fits over the cervix and must be fitted by a health care provider. The cap can be left in place for up to 48 hours after intercourse.  Sponge. This is a soft, circular piece of polyurethane foam. The sponge has spermicide in it. It is inserted into the vagina after wetting it and before sexual intercourse.  Spermicides. These are chemicals that kill or block sperm from entering the cervix and uterus. They come in the form of creams, jellies, suppositories, foam, or tablets. They do not require a prescription. They are inserted into the vagina with an applicator before having sexual intercourse. The process   must be repeated every time you have sexual intercourse. Intrauterine contraception  Intrauterine device (IUD). This is a T-shaped device that is put in a woman's uterus during a menstrual period to prevent pregnancy. There are 2 types: ? Copper IUD. This type of IUD is wrapped in copper wire and is placed inside the uterus. Copper makes the uterus and fallopian tubes produce a fluid that kills sperm. It can stay in place for 10 years. ? Hormone IUD. This type of IUD contains the hormone progestin (synthetic progesterone). The hormone thickens the cervical mucus and prevents sperm from entering the uterus, and it also thins the uterine lining to prevent  implantation of a fertilized egg. The hormone can weaken or kill the sperm that get into the uterus. It can stay in place for 3-5 years, depending on which type of IUD is used. Permanent methods of contraception  Female tubal ligation. This is when the woman's fallopian tubes are surgically sealed, tied, or blocked to prevent the egg from traveling to the uterus.  Hysteroscopic sterilization. This involves placing a small coil or insert into each fallopian tube. Your doctor uses a technique called hysteroscopy to do the procedure. The device causes scar tissue to form. This results in permanent blockage of the fallopian tubes, so the sperm cannot fertilize the egg. It takes about 3 months after the procedure for the tubes to become blocked. You must use another form of birth control for these 3 months.  Female sterilization. This is when the female has the tubes that carry sperm tied off (vasectomy).This blocks sperm from entering the vagina during sexual intercourse. After the procedure, the man can still ejaculate fluid (semen). Natural planning methods  Natural family planning. This is not having sexual intercourse or using a barrier method (condom, diaphragm, cervical cap) on days the woman could become pregnant.  Calendar method. This is keeping track of the length of each menstrual cycle and identifying when you are fertile.  Ovulation method. This is avoiding sexual intercourse during ovulation.  Symptothermal method. This is avoiding sexual intercourse during ovulation, using a thermometer and ovulation symptoms.  Post-ovulation method. This is timing sexual intercourse after you have ovulated. Regardless of which type or method of contraception you choose, it is important that you use condoms to protect against the transmission of sexually transmitted infections (STIs). Talk with your health care provider about which form of contraception is most appropriate for you. This information is not  intended to replace advice given to you by your health care provider. Make sure you discuss any questions you have with your health care provider. Document Released: 06/12/2005 Document Revised: 11/18/2015 Document Reviewed: 12/05/2012 Elsevier Interactive Patient Education  2017 Elsevier Inc.  

## 2017-06-11 NOTE — Progress Notes (Signed)
   PRENATAL VISIT NOTE  Subjective:  Brandi Jennings is a 21 y.o. G1P0 at 2333w0d being seen today for ongoing prenatal care.  She is currently monitored for the following issues for this low-risk pregnancy and has Supervision of normal first pregnancy, antepartum; Vitamin D deficiency; and Anemia affecting pregnancy in third trimester on their problem list.  Patient reports no complaints.  Contractions: Not present. Vag. Bleeding: None.  Movement: Present. Denies leaking of fluid.   The following portions of the patient's history were reviewed and updated as appropriate: allergies, current medications, past family history, past medical history, past social history, past surgical history and problem list. Problem list updated.  Objective:   Vitals:   06/11/17 0903  BP: 107/71  Pulse: 94  Weight: 149 lb 12.8 oz (67.9 kg)    Fetal Status: Fetal Heart Rate (bpm): 134; doppler Fundal Height: 31 cm Movement: Present     General:  Alert, oriented and cooperative. Patient is in no acute distress.  Skin: Skin is warm and dry. No rash noted.   Cardiovascular: Normal heart rate noted  Respiratory: Normal respiratory effort, no problems with respiration noted  Abdomen: Soft, gravid, appropriate for gestational age.  Pain/Pressure: Absent     Pelvic: Cervical exam deferred        Extremities: Normal range of motion.  Edema: Trace  Mental Status:  Normal mood and affect. Normal behavior. Normal judgment and thought content.   Assessment and Plan:  Pregnancy: G1P0 at 5533w0d  1. Supervision of normal first pregnancy, antepartum     Doing well  2. Anemia affecting pregnancy in third trimester     Taking Bloom.  3. Vitamin D deficiency     Taking weekly vitamin D.  Preterm labor symptoms and general obstetric precautions including but not limited to vaginal bleeding, contractions, leaking of fluid and fetal movement were reviewed in detail with the patient. Please refer to After Visit Summary for  other counseling recommendations.  Return in about 2 weeks (around 06/25/2017) for ROB.   Roe Coombsachelle A Jaciel Diem, CNM

## 2017-06-25 ENCOUNTER — Ambulatory Visit (INDEPENDENT_AMBULATORY_CARE_PROVIDER_SITE_OTHER): Payer: Medicaid Other | Admitting: Obstetrics

## 2017-06-25 VITALS — BP 117/65 | HR 92 | Wt 152.6 lb

## 2017-06-25 DIAGNOSIS — Z3400 Encounter for supervision of normal first pregnancy, unspecified trimester: Secondary | ICD-10-CM

## 2017-06-25 DIAGNOSIS — Z3403 Encounter for supervision of normal first pregnancy, third trimester: Secondary | ICD-10-CM

## 2017-06-26 ENCOUNTER — Encounter: Payer: Self-pay | Admitting: Obstetrics

## 2017-06-26 NOTE — L&D Delivery Note (Signed)
Patient is 22 y.o. G1P0 3014w1d admitted for SOL. S/p augmentation with Pitocin. SROM at 0800.  Prenatal course also complicated by anemia.  Delivery Note At 3:02 AM a viable female was delivered via Vaginal, Spontaneous (Presentation: LOA).  APGAR: 9, 9; weight pending.   Placenta status: Intact.  Cord: 3V  with the following complications: None.  Cord pH: N/A  Anesthesia: Epidural Episiotomy: None Lacerations: 2nd degree;Labial Suture Repair: 3.0 vicryl Est. Blood Loss (mL):  400  Mom to postpartum.  Baby to Couplet care / Skin to Skin.   Caryl AdaJazma Valor Quaintance, DO OB Fellow Center for [facility], [facility]

## 2017-06-26 NOTE — Progress Notes (Signed)
Subjective:  Brandi Jennings is a 22 y.o. G1P0 at 8016w1d being seen today for ongoing prenatal care.  She is currently monitored for the following issues for this low-risk pregnancy and has Supervision of normal first pregnancy, antepartum; Vitamin D deficiency; and Anemia affecting pregnancy in third trimester on their problem list.  Patient reports no complaints.  Contractions: Not present. Vag. Bleeding: None.  Movement: Present. Denies leaking of fluid.   The following portions of the patient's history were reviewed and updated as appropriate: allergies, current medications, past family history, past medical history, past social history, past surgical history and problem list. Problem list updated.  Objective:   Vitals:   06/25/17 1040  BP: 117/65  Pulse: 92  Weight: 152 lb 9.6 oz (69.2 kg)    Fetal Status: Fetal Heart Rate (bpm): 140   Movement: Present     General:  Alert, oriented and cooperative. Patient is in no acute distress.  Skin: Skin is warm and dry. No rash noted.   Cardiovascular: Normal heart rate noted  Respiratory: Normal respiratory effort, no problems with respiration noted  Abdomen: Soft, gravid, appropriate for gestational age. Pain/Pressure: Absent     Pelvic:  Cervical exam deferred        Extremities: Normal range of motion.  Edema: Trace  Mental Status: Normal mood and affect. Normal behavior. Normal judgment and thought content.   Urinalysis:      Assessment and Plan:  Pregnancy: G1P0 at 916w1d  1. Supervision of normal first pregnancy, antepartum   Preterm labor symptoms and general obstetric precautions including but not limited to vaginal bleeding, contractions, leaking of fluid and fetal movement were reviewed in detail with the patient. Please refer to After Visit Summary for other counseling recommendations.  Return in about 2 weeks (around 07/09/2017) for ROB.   Brock BadHarper, Ireoluwa Gorsline A, MD

## 2017-07-09 ENCOUNTER — Encounter: Payer: Self-pay | Admitting: Obstetrics

## 2017-07-09 ENCOUNTER — Ambulatory Visit (INDEPENDENT_AMBULATORY_CARE_PROVIDER_SITE_OTHER): Payer: Medicaid Other | Admitting: Obstetrics

## 2017-07-09 DIAGNOSIS — Z3400 Encounter for supervision of normal first pregnancy, unspecified trimester: Secondary | ICD-10-CM

## 2017-07-09 DIAGNOSIS — Z3403 Encounter for supervision of normal first pregnancy, third trimester: Secondary | ICD-10-CM

## 2017-07-09 NOTE — Progress Notes (Signed)
Subjective:  Brandi Jennings is a 22 y.o. G1P0 at 7251w0d being seen today for ongoing prenatal care.  She is currently monitored for the following issues for this low-risk pregnancy and has Supervision of normal first pregnancy, antepartum; Vitamin D deficiency; and Anemia affecting pregnancy in third trimester on their problem list.  Patient reports no complaints.  Contractions: Not present. Vag. Bleeding: None.  Movement: Present. Denies leaking of fluid.   The following portions of the patient's history were reviewed and updated as appropriate: allergies, current medications, past family history, past medical history, past social history, past surgical history and problem list. Problem list updated.  Objective:   Vitals:   07/09/17 0945  BP: 95/61  Pulse: 94  Weight: 152 lb 12.8 oz (69.3 kg)    Fetal Status: Fetal Heart Rate (bpm): 140   Movement: Present     General:  Alert, oriented and cooperative. Patient is in no acute distress.  Skin: Skin is warm and dry. No rash noted.   Cardiovascular: Normal heart rate noted  Respiratory: Normal respiratory effort, no problems with respiration noted  Abdomen: Soft, gravid, appropriate for gestational age. Pain/Pressure: Absent     Pelvic:  Cervical exam deferred        Extremities: Normal range of motion.  Edema: Trace  Mental Status: Normal mood and affect. Normal behavior. Normal judgment and thought content.   Urinalysis:      Assessment and Plan:  Pregnancy: G1P0 at 6351w0d  1. Supervision of normal first pregnancy, antepartum Rx: - Cervicovaginal ancillary only - Strep Gp B NAA  Term labor symptoms and general obstetric precautions including but not limited to vaginal bleeding, contractions, leaking of fluid and fetal movement were reviewed in detail with the patient. Please refer to After Visit Summary for other counseling recommendations.  Return in about 1 week (around 07/16/2017) for ROB.  GBS.   Brock BadHarper, Charles A, MD

## 2017-07-09 NOTE — Addendum Note (Signed)
Addended by: Hamilton CapriBURCH, ARIEL J on: 07/09/2017 11:24 AM   Modules accepted: Orders

## 2017-07-16 ENCOUNTER — Ambulatory Visit (INDEPENDENT_AMBULATORY_CARE_PROVIDER_SITE_OTHER): Payer: Medicaid Other | Admitting: Certified Nurse Midwife

## 2017-07-16 ENCOUNTER — Other Ambulatory Visit (HOSPITAL_COMMUNITY)
Admission: RE | Admit: 2017-07-16 | Discharge: 2017-07-16 | Disposition: A | Discharge status: Home / Self Care | Payer: Medicaid Other | Source: Ambulatory Visit | Attending: Certified Nurse Midwife | Admitting: Certified Nurse Midwife

## 2017-07-16 ENCOUNTER — Encounter: Payer: Self-pay | Admitting: Certified Nurse Midwife

## 2017-07-16 VITALS — BP 107/68 | HR 97 | Wt 157.4 lb

## 2017-07-16 DIAGNOSIS — Z3400 Encounter for supervision of normal first pregnancy, unspecified trimester: Secondary | ICD-10-CM | POA: Insufficient documentation

## 2017-07-16 DIAGNOSIS — Z3A00 Weeks of gestation of pregnancy not specified: Secondary | ICD-10-CM | POA: Diagnosis not present

## 2017-07-16 DIAGNOSIS — E559 Vitamin D deficiency, unspecified: Secondary | ICD-10-CM

## 2017-07-16 DIAGNOSIS — O99013 Anemia complicating pregnancy, third trimester: Secondary | ICD-10-CM

## 2017-07-16 DIAGNOSIS — D649 Anemia, unspecified: Secondary | ICD-10-CM

## 2017-07-16 NOTE — Progress Notes (Signed)
   PRENATAL VISIT NOTE  Subjective:  Brandi Jennings is a 22 y.o. G1P0 at 4833w0d being seen today for ongoing prenatal care.  She is currently monitored for the following issues for this low-risk pregnancy and has Supervision of normal first pregnancy, antepartum; Vitamin D deficiency; and Anemia affecting pregnancy in third trimester on their problem list.  Patient reports no complaints.  Contractions: Irregular. Vag. Bleeding: None.  Movement: Present. Denies leaking of fluid.     Objective:   Vitals:   07/16/17 0857  BP: 107/68  Pulse: 97  Weight: 157 lb 6.4 oz (71.4 kg)    Fetal Status: Fetal Heart Rate (bpm): 132; doppler Fundal Height: 37 cm Movement: Present  Presentation: Vertex  General:  Alert, oriented and cooperative. Patient is in no acute distress.  Skin: Skin is warm and dry. No rash noted.   Cardiovascular: Normal heart rate noted  Respiratory: Normal respiratory effort, no problems with respiration noted  Abdomen: Soft, gravid, appropriate for gestational age.  Pain/Pressure: Absent     Pelvic: Cervical exam performed Dilation: Closed Effacement (%): 20 Station: -3  Extremities: Normal range of motion.  Edema: Trace  Mental Status:  Normal mood and affect. Normal behavior. Normal judgment and thought content.   Assessment and Plan:  Pregnancy: G1P0 at 5133w0d  1. Supervision of normal first pregnancy, antepartum     Doing well.  Cultures obtained.   2. Vitamin D deficiency     Taking weekly vitamin D  3. Anemia affecting pregnancy in third trimester      Taking Bloom.   Preterm labor symptoms and general obstetric precautions including but not limited to vaginal bleeding, contractions, leaking of fluid and fetal movement were reviewed in detail with the patient. Please refer to After Visit Summary for other counseling recommendations.  Return in about 1 week (around 07/23/2017) for ROB.   Roe Coombsachelle A Siriyah Ambrosius, CNM

## 2017-07-16 NOTE — Patient Instructions (Signed)
AREA PEDIATRIC/FAMILY PRACTICE PHYSICIANS  [facility] [address] [place], [place]  [address] Phone - [phone]   Fax - [phone]  [facility] [place] [address] [place], [place] [address] Phone - [phone]   Fax - [phone]  JACK AMOS [address] [place], [place]  [address] Phone - [phone]   Fax - [phone]  [facility] [address] [place], [place]  [address] Phone - [phone]   Fax - [phone]  [facility] [address] [place], [place]  [address] Phone - [phone]   Fax - [phone]  [facility] [address] [place], [place]  [address] Phone - [phone]   Fax - [phone]  [facility] [place] [address] [place], [place]  [address] Phone - [phone]   Fax - [phone]  [facility] [address] [place], [place]  [address] Phone - [phone]   Fax - [phone]  [facility] [address] [place], [place]  [address] Phone - [phone]   Fax - [phone] [facility] [address] [place], [place]  [address] Phone - [phone]   Fax - [phone]  [facility] [address] [place], [place]  [address] Phone - [phone]   Fax - [phone]  [facility] [address] [place], [place]  [address] Phone - [phone]   Fax - [phone]  [facility] [address] [place], [place]  [address] Phone - [phone]   Fax - [phone]  [facility] GOSRANI [address] [place], [place]  [address] Phone - [phone]  [facility] [address] [place], [place]  [address] Phone - [phone]   Fax - [phone]  [facility] [address] [place], [place]  [address] Phone - [phone]   Fax - [phone]  [facility] [address] [place], [place]  [address] Phone - [phone]   Fax - [phone]  [facility] [address] [place], [place]  [address] Phone - [phone]   Fax - [phone]   [facility] [address] [place], [place]  [address] Phone - [phone]   Fax - [phone]  [facility] [address] [place], [place]  [address] Phone - [phone]   Fax - [phone]  [facility] R[facility] [address] [place], [place]  [address] Phone - [phone]   Fax - [phone]  [facility] [address] [place], [place]  [address] Phone - [phone]   Fax - [phone]  [facility] [address] [place], [place]  [address] Phone - [phone]   Fax - [phone] [facility] [address] [place], [place]  [address] Phone - [phone]   Fax - [phone]  [facility] [address] [place], [place]  [address] Phone - [phone]   Fax - [phone]   [facility] [place] [address] [place] [employer],   [address] Phone - [phone]   Fax - [phone]  [facility] [place] Charlene Flemming MD [address] [place]  [address] Phone [phone]  Fax [phone]   

## 2017-07-17 LAB — CERVICOVAGINAL ANCILLARY ONLY
BACTERIAL VAGINITIS: NEGATIVE
CANDIDA VAGINITIS: NEGATIVE
CHLAMYDIA, DNA PROBE: NEGATIVE
NEISSERIA GONORRHEA: NEGATIVE
TRICH (WINDOWPATH): NEGATIVE

## 2017-07-18 ENCOUNTER — Other Ambulatory Visit: Payer: Self-pay | Admitting: Certified Nurse Midwife

## 2017-07-18 DIAGNOSIS — Z3400 Encounter for supervision of normal first pregnancy, unspecified trimester: Secondary | ICD-10-CM

## 2017-07-18 LAB — STREP GP B NAA: STREP GROUP B AG: NEGATIVE

## 2017-07-23 ENCOUNTER — Encounter: Payer: Self-pay | Admitting: Certified Nurse Midwife

## 2017-07-23 ENCOUNTER — Ambulatory Visit (INDEPENDENT_AMBULATORY_CARE_PROVIDER_SITE_OTHER): Payer: Medicaid Other | Admitting: Certified Nurse Midwife

## 2017-07-23 VITALS — BP 118/74 | HR 89 | Wt 155.4 lb

## 2017-07-23 DIAGNOSIS — O99013 Anemia complicating pregnancy, third trimester: Secondary | ICD-10-CM

## 2017-07-23 DIAGNOSIS — E559 Vitamin D deficiency, unspecified: Secondary | ICD-10-CM

## 2017-07-23 DIAGNOSIS — Z3403 Encounter for supervision of normal first pregnancy, third trimester: Secondary | ICD-10-CM

## 2017-07-23 DIAGNOSIS — Z3400 Encounter for supervision of normal first pregnancy, unspecified trimester: Secondary | ICD-10-CM

## 2017-07-23 NOTE — Progress Notes (Signed)
Patient reports good fetal movement, reports feeling contractions at times.

## 2017-07-23 NOTE — Patient Instructions (Addendum)
Before [facility] Before your baby arrives it is important to:  Have all of the supplies that you will need to care for your baby.  Know where to go if there is an emergency.  Discuss the baby's arrival with other family members.  What supplies will I need?  It is recommended that you have the following supplies: Large Items  Crib.  Crib mattress.  Rear-facing infant car seat. If possible, have a trained professional check to make sure that it is installed correctly.  Feeding  6-8 bottles that are 4-5 oz in size.  6-8 nipples.  Bottle brush.  Sterilizer, or a large pan or kettle with a lid.  A way to boil and cool water.  If you will be breastfeeding: ? Breast pump. ? Nipple cream. ? Nursing bra. ? Breast pads. ? Breast shields.  If you will be formula feeding: ? Formula. ? Measuring cups. ? Measuring spoons.  Bathing  Mild baby soap and baby shampoo.  Petroleum jelly.  Soft cloth towel and washcloth.  Hooded towel.  Cotton balls.  Bath basin.  Other Supplies  Rectal thermometer.  Bulb syringe.  Baby wipes or washcloths for diaper changes.  Diaper bag.  Changing pad.  Clothing, including one-piece outfits and pajamas.  Baby nail clippers.  Receiving blankets.  Mattress pad and sheets for the crib.  Night-light for the baby's room.  Baby monitor.  2 or 3 pacifiers.  Either 24-36 cloth diapers and waterproof diaper covers or a box of disposable diapers. You may need to use as many as 10-12 diapers per day.  How do I prepare for an emergency? Prepare for an emergency by:  Knowing how to get to the nearest hospital.  Listing the phone numbers of your baby's health care providers near your home phone and in your cell phone.  How do I prepare my family?  Decide how to handle visitors.  If you have other children: ? Talk with them about the baby coming home. Ask them how they feel about it. ? Read a book together about  being a new big brother or sister. ? Find ways to let them help you prepare for the new baby. ? Have someone ready to care for them while you are in the hospital. This information is not intended to replace advice given to you by your health care provider. Make sure you discuss any questions you have with your health care provider. Document Released: 05/25/2008 Document Revised: 11/18/2015 Document Reviewed: 05/20/2014 Elsevier Interactive Patient Education  2018 [employer].  [employer] of the uterus can occur throughout pregnancy, but they are not always a sign that you are in labor. You may have practice contractions called Braxton Hicks contractions. These false labor contractions are sometimes confused with true labor. What are [place] contractions? Braxton Hicks contractions are tightening movements that occur in the muscles of the uterus before labor. Unlike true labor contractions, these contractions do not result in opening (dilation) and thinning of the cervix. Toward the end of pregnancy (32-34 weeks), Braxton Hicks contractions can happen more often and may become stronger. These contractions are sometimes difficult to tell apart from true labor because they can be very uncomfortable. You should not feel embarrassed if you go to the hospital with false labor. Sometimes, the only way to tell if you are in true labor is for your health care provider to look for changes in the cervix. The health care provider will do a  physical exam and may monitor your contractions. If you are not in true labor, the exam should show that your cervix is not dilating and your water has not broken. If there are other health problems associated with your pregnancy, it is completely safe for you to be sent home with false labor. You may continue to have Braxton Hicks contractions until you go into true labor. How to tell the difference between true labor and false labor True  labor  Contractions last 30-70 seconds.  Contractions become very regular.  Discomfort is usually felt in the top of the uterus, and it spreads to the lower abdomen and low back.  Contractions do not go away with walking.  Contractions usually become more intense and increase in frequency.  The cervix dilates and gets thinner. False labor  Contractions are usually shorter and not as strong as true labor contractions.  Contractions are usually irregular.  Contractions are often felt in the front of the lower abdomen and in the groin.  Contractions may go away when you walk around or change positions while lying down.  Contractions get weaker and are shorter-lasting as time goes on.  The cervix usually does not dilate or become thin. Follow these instructions at home:  Take over-the-counter and prescription medicines only as told by your health care provider.  Keep up with your usual exercises and follow other instructions from your health care provider.  Eat and drink lightly if you think you are going into labor.  If Braxton Hicks contractions are making you uncomfortable: ? Change your position from lying down or resting to walking, or change from walking to resting. ? Sit and rest in a tub of warm water. ? Drink enough fluid to keep your urine pale yellow. Dehydration may cause these contractions. ? Do slow and deep breathing several times an hour.  Keep all follow-up prenatal visits as told by your health care provider. This is important. Contact a health care provider if:  You have a fever.  You have continuous pain in your abdomen. Get help right away if:  Your contractions become stronger, more regular, and closer together.  You have fluid leaking or gushing from your vagina.  You pass blood-tinged mucus (bloody show).  You have bleeding from your vagina.  You have low back pain that you never had before.  You feel your baby's head pushing down and  causing pelvic pressure.  Your baby is not moving inside you as much as it used to. Summary  Contractions that occur before labor are called Braxton Hicks contractions, false labor, or practice contractions.  Braxton Hicks contractions are usually shorter, weaker, farther apart, and less regular than true labor contractions. True labor contractions usually become progressively stronger and regular and they become more frequent.  Manage discomfort from Braxton Hicks contractions by changing position, resting in a warm bath, drinking plenty of water, or practicing deep breathing. This information is not intended to replace advice given to you by your health care provider. Make sure you discuss any questions you have with your health care provider. Document Released: 10/26/2016 Document Revised: 10/26/2016 Document Reviewed: 10/26/2016 Elsevier Interactive Patient Education  2018 Elsevier Inc.  Third Trimester of Pregnancy The third trimester is from week 29 through week 42, months 7 through 9. This trimester is when your unborn baby (fetus) is growing very fast. At the end of the ninth month, the unborn baby is about 20 inches in length. It weighs about 6-10 pounds. Follow   these instructions at home:  Avoid all smoking, herbs, and alcohol. Avoid drugs not approved by your doctor.  Do not use any tobacco products, including cigarettes, chewing tobacco, and electronic cigarettes. If you need help quitting, ask your doctor. You may get counseling or other support to help you quit.  Only take medicine as told by your doctor. Some medicines are safe and some are not during pregnancy.  Exercise only as told by your doctor. Stop exercising if you start having cramps.  Eat regular, healthy meals.  Wear a good support bra if your breasts are tender.  Do not use hot tubs, steam rooms, or saunas.  Wear your seat belt when driving.  Avoid raw meat, uncooked cheese, and liter boxes and soil used  by cats.  Take your prenatal vitamins.  Take 1500-2000 milligrams of calcium daily starting at the 20th week of pregnancy until you deliver your baby.  Try taking medicine that helps you poop (stool softener) as needed, and if your doctor approves. Eat more fiber by eating fresh fruit, vegetables, and whole grains. Drink enough fluids to keep your pee (urine) clear or pale yellow.  Take warm water baths (sitz baths) to soothe pain or discomfort caused by hemorrhoids. Use hemorrhoid cream if your doctor approves.  If you have puffy, bulging veins (varicose veins), wear support hose. Raise (elevate) your feet for 15 minutes, 3-4 times a day. Limit salt in your diet.  Avoid heavy lifting, wear low heels, and sit up straight.  Rest with your legs raised if you have leg cramps or low back pain.  Visit your dentist if you have not gone during your pregnancy. Use a soft toothbrush to brush your teeth. Be gentle when you floss.  You can have sex (intercourse) unless your doctor tells you not to.  Do not travel far distances unless you must. Only do so with your doctor's approval.  Take prenatal classes.  Practice driving to the hospital.  Pack your hospital bag.  Prepare the baby's room.  Go to your doctor visits. Get help if:  You are not sure if you are in labor or if your water has broken.  You are dizzy.  You have mild cramps or pressure in your lower belly (abdominal).  You have a nagging pain in your belly area.  You continue to feel sick to your stomach (nauseous), throw up (vomit), or have watery poop (diarrhea).  You have bad smelling fluid coming from your vagina.  You have pain with peeing (urination). Get help right away if:  You have a fever.  You are leaking fluid from your vagina.  You are spotting or bleeding from your vagina.  You have severe belly cramping or pain.  You lose or gain weight rapidly.  You have trouble catching your breath and have  chest pain.  You notice sudden or extreme puffiness (swelling) of your face, hands, ankles, feet, or legs.  You have not felt the baby move in over an hour.  You have severe headaches that do not go away with medicine.  You have vision changes. This information is not intended to replace advice given to you by your health care provider. Make sure you discuss any questions you have with your health care provider. Document Released: 09/06/2009 Document Revised: 11/18/2015 Document Reviewed: 08/13/2012 Elsevier Interactive Patient Education  2017 Elsevier Inc.  Pain Relief During Labor and Delivery Many things can cause pain during labor and delivery, including:  Pressure on bones and  ligaments due to the baby moving through the pelvis.  Stretching of tissues due to the baby moving through the birth canal.  Muscle tension due to anxiety or nervousness.  The uterus tightening (contracting) and relaxing to help move the baby.  There are many ways to deal with the pain of labor and delivery. They include:  Taking prenatal classes. Taking these classes helps you know what to expect during your baby's birth. What you learn will increase your confidence and decrease your anxiety.  Practicing relaxation techniques or doing relaxing activities, such as: ? Focused breathing. ? Meditation. ? Visualization. ? Aroma therapy. ? Listening to your favorite music. ? Hypnosis.  Taking a warm shower or bath (hydrotherapy). This may: ? Provide comfort and relaxation. ? Lessen your perception of pain. ? Decrease the amount of pain medicine needed. ? Decrease the length of labor.  Getting a massage or counterpressure on your back.  Applying warm packs or ice packs.  Changing positions often, moving around, or using a birthing ball.  Getting: ? Pain medicine through an IV or injection into a muscle. ? Pain medicine inserted into your spinal column. ? Injections of sterile water just under  the skin on your lower back (intradermal injections). ? Laughing gas (nitrous oxide).  Discuss your pain control options with your health care provider during your prenatal visits. Explore the options offered by your hospital or birth center. What kinds of medicine are available? There are two kinds of medicines that can be used to relieve pain during labor and delivery:  Analgesics. These medicines decrease pain without causing you to lose feeling or the ability to move your muscles.  Anesthetics. These medicines block feeling in the body and can decrease your ability to move freely.  Both of these kinds of medicine can cause minor side effects, such as nausea, trouble concentrating, and sleepiness. They can also decrease the baby's heart rate before birth and affect the baby's breathing rate after birth. For this reason, health care providers are careful about when and how much medicine is given. What are specific medicines and procedures that provide pain relief? Local Anesthetics Local anesthetics are used to numb a small area of the body. They may be used along with another kind of anesthetic or used to numb the nerves of the vagina, cervix, and perineum during the second stage of labor. General Anesthetics General anesthetics cause you to lose consciousness so you do not feel pain. They are usually only used for an emergency cesarean delivery. General anesthetics are given through an IV tube and a mask. Pudendal Block A pudendal block is a form of local anesthetic. It may be used to relieve the pain associated with pushing or stretching of the perineum at the time of delivery or to further numb the perineum. A pudendal block is done by injecting numbing medicine through the vaginal wall into a nerve in the pelvis. Epidural Analgesia Epidural analgesia is given through a flexible IV catheter that is inserted into the lower back. Numbing medicine is delivered continuously to the area near  your spinal column nerves (epidural space). After having this type of analgesia, you may be able to move your legs but you most likely will not be able to walk. Depending on the amount of medicine given, you may lose all feeling in the lower half of your body, or you may retain some level of sensation, including the urge to push. Epidural analgesia can be used to provide pain relief for  a vaginal birth. Spinal Block A spinal block is similar to epidural analgesia, but the medicine is injected into the spinal fluid instead of the epidural space. A spinal block is only given once. It starts to relieve pain quickly, but the pain relief lasts only 1-6 hours. Spinal blocks can be used for cesarean deliveries. Combined Spinal-Epidural (CSE) Block A CSE block combines the effects of a spinal block and epidural analgesia. The spinal block works quickly to block all pain. The epidural analgesia provides continuous pain relief, even after the effects of the spinal block have worn off. This information is not intended to replace advice given to you by your health care provider. Make sure you discuss any questions you have with your health care provider. Document Released: 09/28/2008 Document Revised: 11/19/2015 Document Reviewed: 11/03/2015 Elsevier Interactive Patient Education  [employer].

## 2017-07-23 NOTE — Progress Notes (Signed)
   PRENATAL VISIT NOTE  Subjective:  Brandi Jennings is a 22 y.o. G1P0 at 5860w0d being seen today for ongoing prenatal care.  She is currently monitored for the following issues for this low-risk pregnancy and has Supervision of normal first pregnancy, antepartum; Vitamin D deficiency; and Anemia affecting pregnancy in third trimester on their problem list.  Patient reports no complaints.  Contractions: Irregular. Vag. Bleeding: None.  Movement: Present. Denies leaking of fluid.   The following portions of the patient's history were reviewed and updated as appropriate: allergies, current medications, past family history, past medical history, past social history, past surgical history and problem list. Problem list updated.  Objective:   Vitals:   07/23/17 0926  BP: 118/74  Pulse: 89  Weight: 155 lb 6.4 oz (70.5 kg)    Fetal Status: Fetal Heart Rate (bpm): 140; doppler Fundal Height: 39 cm Movement: Present     General:  Alert, oriented and cooperative. Patient is in no acute distress.  Skin: Skin is warm and dry. No rash noted.   Cardiovascular: Normal heart rate noted  Respiratory: Normal respiratory effort, no problems with respiration noted  Abdomen: Soft, gravid, appropriate for gestational age.  Pain/Pressure: Absent     Pelvic: Cervical exam deferred        Extremities: Normal range of motion.  Edema: Trace  Mental Status:  Normal mood and affect. Normal behavior. Normal judgment and thought content.   Assessment and Plan:  Pregnancy: G1P0 at 6560w0d  1. Supervision of normal first pregnancy, antepartum     Doing well.   2. Anemia affecting pregnancy in third trimester     Taking Bloom  3. Vitamin D deficiency     Taking weekly vitamin D.   Term labor symptoms and general obstetric precautions including but not limited to vaginal bleeding, contractions, leaking of fluid and fetal movement were reviewed in detail with the patient. Please refer to After Visit Summary for other  counseling recommendations.  Return in about 1 week (around 07/30/2017) for ROB.   Roe Coombsachelle A Sharry Beining, CNM

## 2017-08-01 ENCOUNTER — Encounter: Payer: Self-pay | Admitting: Certified Nurse Midwife

## 2017-08-01 ENCOUNTER — Ambulatory Visit (INDEPENDENT_AMBULATORY_CARE_PROVIDER_SITE_OTHER): Payer: Medicaid Other | Admitting: Certified Nurse Midwife

## 2017-08-01 VITALS — BP 112/66 | HR 88 | Wt 157.7 lb

## 2017-08-01 DIAGNOSIS — E559 Vitamin D deficiency, unspecified: Secondary | ICD-10-CM

## 2017-08-01 DIAGNOSIS — Z3400 Encounter for supervision of normal first pregnancy, unspecified trimester: Secondary | ICD-10-CM

## 2017-08-01 NOTE — Progress Notes (Signed)
   PRENATAL VISIT NOTE  Subjective:  Brandi Jennings is a 22 y.o. G1P0 at 194w2d being seen today for ongoing prenatal care.  She is currently monitored for the following issues for this low-risk pregnancy and has Supervision of normal first pregnancy, antepartum; Vitamin D deficiency; and Anemia affecting pregnancy in third trimester on their problem list.  Patient reports no bleeding, no leaking and occasional contractions.  Contractions: Not present. Vag. Bleeding: None.  Movement: Present. Denies leaking of fluid.   The following portions of the patient's history were reviewed and updated as appropriate: allergies, current medications, past family history, past medical history, past social history, past surgical history and problem list. Problem list updated.  Objective:   Vitals:   08/01/17 0848  BP: 112/66  Pulse: 88  Weight: 157 lb 11.2 oz (71.5 kg)    Fetal Status: Fetal Heart Rate (bpm): 131; doppler Fundal Height: 40 cm Movement: Present  Presentation: Vertex  General:  Alert, oriented and cooperative. Patient is in no acute distress.  Skin: Skin is warm and dry. No rash noted.   Cardiovascular: Normal heart rate noted  Respiratory: Normal respiratory effort, no problems with respiration noted  Abdomen: Soft, gravid, appropriate for gestational age.  Pain/Pressure: Present     Pelvic: Cervical exam performed Dilation: 1 Effacement (%): 50 Station: -3  Extremities: Normal range of motion.  Edema: Trace  Mental Status:  Normal mood and affect. Normal behavior. Normal judgment and thought content.   Assessment and Plan:  Pregnancy: G1P0 at 324w2d  1. Supervision of normal first pregnancy, antepartum     Doing well  2. Vitamin D deficiency      Taking weekly vitamin D  Preterm labor symptoms and general obstetric precautions including but not limited to vaginal bleeding, contractions, leaking of fluid and fetal movement were reviewed in detail with the patient. Please refer to  After Visit Summary for other counseling recommendations.  Return in about 1 week (around 08/08/2017) for ROB, schedule IOL for 41 weeks.   Roe Coombsachelle A Marely Apgar, CNM

## 2017-08-01 NOTE — Progress Notes (Signed)
Pt c/o slight pelvic pain/pressure

## 2017-08-06 ENCOUNTER — Encounter (HOSPITAL_COMMUNITY): Payer: Self-pay | Admitting: *Deleted

## 2017-08-06 ENCOUNTER — Other Ambulatory Visit: Payer: Self-pay

## 2017-08-06 ENCOUNTER — Inpatient Hospital Stay (HOSPITAL_COMMUNITY): Payer: Medicaid Other | Admitting: Anesthesiology

## 2017-08-06 ENCOUNTER — Inpatient Hospital Stay (HOSPITAL_COMMUNITY)
Admission: AD | Admit: 2017-08-06 | Discharge: 2017-08-08 | DRG: 806 | Disposition: A | Discharge status: Home / Self Care | Payer: Medicaid Other | Source: Ambulatory Visit | Attending: Obstetrics & Gynecology | Admitting: Obstetrics & Gynecology

## 2017-08-06 DIAGNOSIS — O4292 Full-term premature rupture of membranes, unspecified as to length of time between rupture and onset of labor: Secondary | ICD-10-CM | POA: Diagnosis present

## 2017-08-06 DIAGNOSIS — Z3A39 39 weeks gestation of pregnancy: Secondary | ICD-10-CM | POA: Diagnosis not present

## 2017-08-06 DIAGNOSIS — Z3400 Encounter for supervision of normal first pregnancy, unspecified trimester: Secondary | ICD-10-CM

## 2017-08-06 DIAGNOSIS — O4202 Full-term premature rupture of membranes, onset of labor within 24 hours of rupture: Secondary | ICD-10-CM | POA: Diagnosis not present

## 2017-08-06 LAB — TYPE AND SCREEN
ABO/RH(D): B POS
Antibody Screen: NEGATIVE

## 2017-08-06 LAB — CBC
HEMATOCRIT: 35.6 % — AB (ref 36.0–46.0)
Hemoglobin: 12.3 g/dL (ref 12.0–15.0)
MCH: 30.6 pg (ref 26.0–34.0)
MCHC: 34.6 g/dL (ref 30.0–36.0)
MCV: 88.6 fL (ref 78.0–100.0)
Platelets: 163 10*3/uL (ref 150–400)
RBC: 4.02 MIL/uL (ref 3.87–5.11)
RDW: 15.9 % — AB (ref 11.5–15.5)
WBC: 12.4 10*3/uL — AB (ref 4.0–10.5)

## 2017-08-06 LAB — ABO/RH: ABO/RH(D): B POS

## 2017-08-06 LAB — POCT FERN TEST: POCT Fern Test: POSITIVE

## 2017-08-06 MED ORDER — OXYTOCIN 40 UNITS IN LACTATED RINGERS INFUSION - SIMPLE MED
1.0000 m[IU]/min | INTRAVENOUS | Status: DC
  Administered 2017-08-06: 2 m[IU]/min via INTRAVENOUS

## 2017-08-06 MED ORDER — SOD CITRATE-CITRIC ACID 500-334 MG/5ML PO SOLN: 30.0000 mL | ORAL | Status: DC | PRN

## 2017-08-06 MED ORDER — ACETAMINOPHEN 325 MG PO TABS
650.0000 mg | ORAL_TABLET | ORAL | Status: DC | PRN
  Administered 2017-08-07: 650 mg via ORAL
  Filled 2017-08-06: qty 2

## 2017-08-06 MED ORDER — LIDOCAINE HCL (PF) 1 % IJ SOLN
30.0000 mL | INTRAMUSCULAR | Status: DC | PRN
  Filled 2017-08-06: qty 30

## 2017-08-06 MED ORDER — LIDOCAINE HCL (PF) 1 % IJ SOLN
INTRAMUSCULAR | Status: DC | PRN
  Administered 2017-08-06 (×2): 5 mL

## 2017-08-06 MED ORDER — OXYTOCIN BOLUS FROM INFUSION
500.0000 mL | Freq: Once | INTRAVENOUS | Status: AC
  Administered 2017-08-07: 500 mL via INTRAVENOUS

## 2017-08-06 MED ORDER — LACTATED RINGERS IV SOLN: 500.0000 mL | INTRAVENOUS | Status: DC | PRN

## 2017-08-06 MED ORDER — TERBUTALINE SULFATE 1 MG/ML IJ SOLN
0.2500 mg | Freq: Once | INTRAMUSCULAR | Status: DC | PRN
  Filled 2017-08-06: qty 1

## 2017-08-06 MED ORDER — FENTANYL 2.5 MCG/ML BUPIVACAINE 1/10 % EPIDURAL INFUSION (WH - ANES)
14.0000 mL/h | INTRAMUSCULAR | Status: DC | PRN
  Administered 2017-08-06 (×2): 14 mL/h via EPIDURAL
  Filled 2017-08-06 (×2): qty 100

## 2017-08-06 MED ORDER — PHENYLEPHRINE 40 MCG/ML (10ML) SYRINGE FOR IV PUSH (FOR BLOOD PRESSURE SUPPORT)
80.0000 ug | PREFILLED_SYRINGE | INTRAVENOUS | Status: DC | PRN
  Filled 2017-08-06: qty 5

## 2017-08-06 MED ORDER — PHENYLEPHRINE 40 MCG/ML (10ML) SYRINGE FOR IV PUSH (FOR BLOOD PRESSURE SUPPORT)
80.0000 ug | PREFILLED_SYRINGE | INTRAVENOUS | Status: DC | PRN
  Filled 2017-08-06: qty 10
  Filled 2017-08-06: qty 5

## 2017-08-06 MED ORDER — LACTATED RINGERS IV SOLN: 500.0000 mL | Freq: Once | INTRAVENOUS | Status: DC

## 2017-08-06 MED ORDER — ONDANSETRON HCL 4 MG/2ML IJ SOLN: 4.0000 mg | Freq: Four times a day (QID) | INTRAMUSCULAR | Status: DC | PRN

## 2017-08-06 MED ORDER — OXYCODONE-ACETAMINOPHEN 5-325 MG PO TABS: 1.0000 | ORAL_TABLET | ORAL | Status: DC | PRN

## 2017-08-06 MED ORDER — OXYCODONE-ACETAMINOPHEN 5-325 MG PO TABS: 2.0000 | ORAL_TABLET | ORAL | Status: DC | PRN

## 2017-08-06 MED ORDER — LACTATED RINGERS IV SOLN
INTRAVENOUS | Status: DC
  Administered 2017-08-06 (×2): via INTRAVENOUS

## 2017-08-06 MED ORDER — EPHEDRINE 5 MG/ML INJ
10.0000 mg | INTRAVENOUS | Status: DC | PRN
  Filled 2017-08-06: qty 2

## 2017-08-06 MED ORDER — DIPHENHYDRAMINE HCL 50 MG/ML IJ SOLN: 12.5000 mg | INTRAMUSCULAR | Status: DC | PRN

## 2017-08-06 MED ORDER — FLEET ENEMA 7-19 GM/118ML RE ENEM: 1.0000 | ENEMA | RECTAL | Status: DC | PRN

## 2017-08-06 MED ORDER — FENTANYL CITRATE (PF) 100 MCG/2ML IJ SOLN: 100.0000 ug | INTRAMUSCULAR | Status: DC | PRN

## 2017-08-06 MED ORDER — OXYTOCIN 40 UNITS IN LACTATED RINGERS INFUSION - SIMPLE MED
2.5000 [IU]/h | INTRAVENOUS | Status: DC
  Filled 2017-08-06: qty 1000

## 2017-08-06 NOTE — Anesthesia Procedure Notes (Signed)
Epidural Patient location during procedure: OB  Staffing Anesthesiologist: Brendin Situ, MD Performed: anesthesiologist   Preanesthetic Checklist Completed: patient identified, site marked, surgical consent, pre-op evaluation, timeout performed, IV checked, risks and benefits discussed and monitors and equipment checked  Epidural Patient position: sitting Prep: DuraPrep Patient monitoring: heart rate, continuous pulse ox and blood pressure Approach: right paramedian Location: L3-L4 Injection technique: LOR saline  Needle:  Needle type: Tuohy  Needle gauge: 17 G Needle length: 9 cm and 9 Needle insertion depth: 5 cm Catheter type: closed end flexible Catheter size: 20 Guage Catheter at skin depth: 9 cm Test dose: negative  Assessment Events: blood not aspirated, injection not painful, no injection resistance, negative IV test and no paresthesia  Additional Notes Patient identified. Risks/Benefits/Options discussed with patient including but not limited to bleeding, infection, nerve damage, paralysis, failed block, incomplete pain control, headache, blood pressure changes, nausea, vomiting, reactions to medication both or allergic, itching and postpartum back pain. Confirmed with bedside nurse the patient's most recent platelet count. Confirmed with patient that they are not currently taking any anticoagulation, have any bleeding history or any family history of bleeding disorders. Patient expressed understanding and wished to proceed. All questions were answered. Sterile technique was used throughout the entire procedure. Please see nursing notes for vital signs. Test dose was given through epidural needle and negative prior to continuing to dose epidural or start infusion. Warning signs of high block given to the patient including shortness of breath, tingling/numbness in hands, complete motor block, or any concerning symptoms with instructions to call for help. Patient was given  instructions on fall risk and not to get out of bed. All questions and concerns addressed with instructions to call with any issues.     

## 2017-08-06 NOTE — MAU Note (Signed)
Pt states her mucous plug came out yesterday and this morning around 0800 she started leaking "water and it keeps coming."

## 2017-08-06 NOTE — Progress Notes (Signed)
Labor Progress Note Brandi Jennings is a 22 y.o. G1P0 at 3368w0d presented for ROM S:  Patient is comfortable and talkative. States that she is having no pain and is feeling no contractions. She states that she feels the baby kick some. No other complaints.  O:  BP 103/64   Pulse 97   Temp 98.3 F (36.8 C) (Oral)   Resp 18   Ht 5' (1.524 m)   Wt 71.3 kg (157 lb 4 oz)   LMP 11/06/2016   SpO2 100%   BMI 30.71 kg/m  EFM: 150/moderate variability/no decelerations   CVE: Dilation: 7 Effacement (%): 100 Station: -1 Presentation: Vertex Exam by:: Barrie DunkerMurayyah Johnson RN   A&P: 22 y.o. G1P0 5868w0d for rupture of membranes. Progressing well on pitocin  #Labor: Progressing well. Currently at 7cm/100% (last note @1750  revealed 4cm/70%) #Pain: Per patient request, currently receiving fentanyl. Denies pain #FWB: Category 1 #GBS negative  Claudine Moutonyler Franklin [place], Student-PA 9:54 PM

## 2017-08-06 NOTE — MAU Note (Signed)
Urine in the lab  

## 2017-08-06 NOTE — Anesthesia Pain Management Evaluation Note (Signed)
  CRNA Pain Management Visit Note  Patient: Brandi Jennings, 22 y.o., female  "Hello I am a member of the anesthesia team at [facility]. We have an anesthesia team available at all times to provide care throughout the hospital, including epidural management and anesthesia for C-section. I don't know your plan for the delivery whether it a natural birth, water birth, IV sedation, nitrous supplementation, doula or epidural, but we want to meet your pain goals."   1.Was your pain managed to your expectations on prior hospitalizations?   Yes   2.What is your expectation for pain management during this hospitalization?     Epidural  3.How can we help you reach that goal? epidural  Record the patient's initial score and the patient's pain goal.   Pain: 0  Pain Goal: 4 [facility] wants you to be able to say your pain was always managed very well.  Brandi Jennings 08/06/2017

## 2017-08-06 NOTE — Anesthesia Preprocedure Evaluation (Signed)

## 2017-08-06 NOTE — Progress Notes (Signed)
[facility] called to [facility].  Will have charge RN call back with room number.

## 2017-08-06 NOTE — H&P (Signed)
LABOR AND DELIVERY ADMISSION HISTORY AND PHYSICAL NOTE  Brandi Jennings is a 22 y.o. female G1P0 with IUP at 4825w0d by LMP presenting for PROM. Reports contractions at home every 5-6 min apart.  She reports positive fetal movement. She reports leakage of mucous and then of fluid starting at home around 0800. Reports some intermittent bleeding as mucous was leaking. Patient was fern test positive in MAU.   Prenatal History/Complications: PNC at [facility] Pregnancy complications:  - vit D deficiency  - anemia   Sono: @[redacted]w[redacted]d , CWD, normal anatomy, breech presentation, posterior placenta, above cervical os, 452g, 55% EFW  Past Medical History: History reviewed. No pertinent past medical history.  Past Surgical History: History reviewed. No pertinent surgical history.  Obstetrical History: OB History    Gravida Para Term Preterm AB Living   1             SAB TAB Ectopic Multiple Live Births                  Social History: Social History   Socioeconomic History  . Marital status: Married    Spouse name: None  . Number of children: None  . Years of education: None  . Highest education level: None  Social Needs  . Financial resource strain: None  . Food insecurity - worry: None  . Food insecurity - inability: None  . Transportation needs - medical: None  . Transportation needs - non-medical: None  Occupational History  . None  Tobacco Use  . Smoking status: Never Smoker  . Smokeless tobacco: Never Used  Substance and Sexual Activity  . Alcohol use: No  . Drug use: No  . Sexual activity: Yes    Birth control/protection: None  Other Topics Concern  . None  Social History Narrative  . None    Family History: Family History  Problem Relation Age of Onset  . Hypertension Mother     Allergies: Allergies  Allergen Reactions  . Shrimp [Shellfish Allergy] Itching and Rash    Medications Prior to Admission  Medication Sig Dispense Refill Last Dose  .  Prenatal-DSS-FeCb-FeGl-FA (CITRANATAL BLOOM) 90-1 MG TABS Take 1 tablet by mouth daily. 30 tablet 12 08/05/2017 at Unknown time  . Vitamin D, Ergocalciferol, (DRISDOL) 50000 units CAPS capsule Take 1 capsule (50,000 Units total) by mouth every 7 (seven) days. 30 capsule 2 Past Week at Unknown time     Review of Systems  All systems reviewed and negative except as stated in HPI  Physical Exam Blood pressure 103/61, pulse 83, temperature 98.1 F (36.7 C), temperature source Oral, resp. rate 18, height 5' (1.524 m), weight 71.3 kg (157 lb 4 oz), last menstrual period 11/06/2016, SpO2 99 %. General appearance: alert, oriented, NAD Lungs: normal respiratory effort Heart: regular rate Abdomen: soft, non-tender; gravid, FH appropriate for GA Extremities: No calf swelling or tenderness Presentation: cephalic per MAU exam  Fetal monitoring: FHR 135, moderate variability, +accel, -decel  Uterine activity: irregular, every 2-4 min Dilation: 3.5 Effacement (%): 70 Station: -2 Exam by:: Valentina Lucks. Woods, RN  Prenatal labs: ABO, Rh: --/--/B POS, B POS Performed at [facility], [address] [place], [place] [address]  [phone](02/11 0945) Antibody: NEG (02/11 0945) Rubella: 7.86 (08/09 1635) RPR: Non Reactive (12/03 1001)  HBsAg: Negative (08/09 1635)  HIV: Non Reactive (12/03 1001)  GC/Chlamydia: Negative 07/16/17 GBS: Negative (01/21 1000)  2-hr GTT: 87/136/121 Genetic screening:  Mat 21 normal  Anatomy US: normal, female   Scientist, physiologicalrenatal Transfer Tool  Maternal Diabetes: No Genetic Screening: Normal Maternal Ultrasounds/Referrals: Normal Fetal Ultrasounds or other Referrals:  None Maternal Substance Abuse:  No Significant Maternal Medications:  None Significant Maternal Lab Results: Lab values include: Group B Strep negative  Results for orders placed or performed during the hospital encounter of 08/06/17 (from the past 24 hour(s))  POCT fern test   Collection Time: 08/06/17  9:26 AM  Result  Value Ref Range   POCT Fern Test Positive = ruptured amniotic membanes   CBC   Collection Time: 08/06/17  9:45 AM  Result Value Ref Range   WBC 12.4 (H) 4.0 - 10.5 K/uL   RBC 4.02 3.87 - 5.11 MIL/uL   Hemoglobin 12.3 12.0 - 15.0 g/dL   HCT 16.1 (L) 09.6 - 04.5 %   MCV 88.6 78.0 - 100.0 fL   MCH 30.6 26.0 - 34.0 pg   MCHC 34.6 30.0 - 36.0 g/dL   RDW 40.9 (H) 81.1 - 91.4 %   Platelets 163 150 - 400 K/uL  Type and screen [facility]   Collection Time: 08/06/17  9:45 AM  Result Value Ref Range   ABO/RH(D) B POS    Antibody Screen NEG    Sample Expiration      08/09/2017 Performed at [facility], [address], [place], [place] [address]   ABO/Rh   Collection Time: 08/06/17  9:45 AM  Result Value Ref Range   ABO/RH(D)      B POS Performed at [facility], [address], [place], [place] [address]     Patient Active Problem List   Diagnosis Date Noted  . Labor and delivery, indication for care 08/06/2017  . Anemia affecting pregnancy in third trimester 05/30/2017  . Vitamin D deficiency 02/07/2017  . Supervision of normal first pregnancy, antepartum 02/01/2017    Assessment: Brandi Jennings is a 22 y.o. G1P0 at [redacted]w[redacted]d here for PROM. Early labor.   #Labor: anticipate NSVD, expectant management. If no progression of labor can consider adding pitocin for augmentation #Pain: Per patient request, planning on epidural  #FWB: Category 1  #ID:  GBS neg  #MOF: both breast and bottle  #MOC:depo  #Circ:  No   Oralia Manis, DO PGY-1 08/06/2017, 2:13 PM  OB FELLOW HISTORY AND PHYSICAL ATTESTATION  I confirm that I have verified the information documented in the resident's note and that I have also personally reperformed the physical exam and all medical decision making activities. I agree with above documentation and have made edits as needed.   Caryl Ada, DO OB Fellow 08/06/2017, 5:54 PM

## 2017-08-06 NOTE — Progress Notes (Signed)
Labor Progress Note  Brandi Jennings is a 22 y.o. G1P0 at 4266w0d  admitted for rupture of membranes  S: Comfortable with epidural. No concerns.   O:  BP 106/61   Pulse 74   Temp 97.9 F (36.6 C) (Oral)   Resp 18   Ht 5' (1.524 m)   Wt 71.3 kg (157 lb 4 oz)   LMP 11/06/2016   SpO2 100%   BMI 30.71 kg/m   Total I/O In: -  Out: 500 [Urine:500]  FHT:  FHR: 130 bpm, variability: moderate,  accelerations:  Present,  decelerations:  Absent UC:   regular, every 2-5 minutes SVE:   Dilation: 4 Effacement (%): 70 Station: 0 Exam by:: Valentina Lucks. Woods, RN   SROM: 0800, clear  Labs: Lab Results  Component Value Date   WBC 12.4 (H) 08/06/2017   HGB 12.3 08/06/2017   HCT 35.6 (L) 08/06/2017   MCV 88.6 08/06/2017   PLT 163 08/06/2017    Assessment / Plan: 22 y.o. G1P0 8066w0d in early labor. Spontaneous labor, progressing normally  Labor: Progressing normally, has not made much change and contractions spacing out so will augment labor with pitocin Fetal Wellbeing:  Category I Pain Control:  Epidural Anticipated MOD:  NSVD  Expectant management   Caryl AdaJazma Chanetta Moosman, DO OB Fellow Center for [facility], [facility]

## 2017-08-07 ENCOUNTER — Encounter: Payer: Medicaid Other | Admitting: Certified Nurse Midwife

## 2017-08-07 ENCOUNTER — Encounter (HOSPITAL_COMMUNITY): Payer: Self-pay

## 2017-08-07 DIAGNOSIS — O4202 Full-term premature rupture of membranes, onset of labor within 24 hours of rupture: Secondary | ICD-10-CM

## 2017-08-07 DIAGNOSIS — Z3A39 39 weeks gestation of pregnancy: Secondary | ICD-10-CM

## 2017-08-07 LAB — RPR: RPR Ser Ql: NONREACTIVE

## 2017-08-07 MED ORDER — OXYCODONE HCL 5 MG PO TABS: 5.0000 mg | ORAL_TABLET | ORAL | Status: DC | PRN

## 2017-08-07 MED ORDER — GENTAMICIN SULFATE 40 MG/ML IJ SOLN
120.0000 mg | Freq: Three times a day (TID) | INTRAVENOUS | Status: DC
  Administered 2017-08-07: 120 mg via INTRAVENOUS
  Filled 2017-08-07 (×2): qty 3

## 2017-08-07 MED ORDER — ACETAMINOPHEN 325 MG PO TABS
650.0000 mg | ORAL_TABLET | ORAL | Status: DC | PRN
Start: 2017-08-07 — End: 2017-08-08
  Administered 2017-08-07: 650 mg via ORAL
  Filled 2017-08-07: qty 2

## 2017-08-07 MED ORDER — COCONUT OIL OIL: 1.0000 "application " | TOPICAL_OIL | Status: DC | PRN

## 2017-08-07 MED ORDER — CLINDAMYCIN PHOSPHATE 600 MG/50ML IV SOLN: 600.0000 mg | Freq: Four times a day (QID) | INTRAVENOUS | Status: DC

## 2017-08-07 MED ORDER — SIMETHICONE 80 MG PO CHEW: 80.0000 mg | CHEWABLE_TABLET | ORAL | Status: DC | PRN

## 2017-08-07 MED ORDER — DIBUCAINE 1 % RE OINT: 1.0000 "application " | TOPICAL_OINTMENT | RECTAL | Status: DC | PRN

## 2017-08-07 MED ORDER — ONDANSETRON HCL 4 MG PO TABS: 4.0000 mg | ORAL_TABLET | ORAL | Status: DC | PRN

## 2017-08-07 MED ORDER — AMPICILLIN SODIUM 2 G IJ SOLR
2.0000 g | Freq: Four times a day (QID) | INTRAMUSCULAR | Status: DC
  Administered 2017-08-07: 2 g via INTRAVENOUS
  Filled 2017-08-07 (×3): qty 2000

## 2017-08-07 MED ORDER — ZOLPIDEM TARTRATE 5 MG PO TABS
5.0000 mg | ORAL_TABLET | Freq: Every evening | ORAL | Status: DC | PRN
Start: 2017-08-07 — End: 2017-08-08

## 2017-08-07 MED ORDER — TETANUS-DIPHTH-ACELL PERTUSSIS 5-2.5-18.5 LF-MCG/0.5 IM SUSP: 0.5000 mL | Freq: Once | INTRAMUSCULAR | Status: DC

## 2017-08-07 MED ORDER — DIPHENHYDRAMINE HCL 25 MG PO CAPS: 25.0000 mg | ORAL_CAPSULE | Freq: Four times a day (QID) | ORAL | Status: DC | PRN

## 2017-08-07 MED ORDER — PRENATAL MULTIVITAMIN CH
1.0000 | ORAL_TABLET | Freq: Every day | ORAL | Status: DC
  Administered 2017-08-07 – 2017-08-08 (×2): 1 via ORAL
  Filled 2017-08-07 (×2): qty 1

## 2017-08-07 MED ORDER — WITCH HAZEL-GLYCERIN EX PADS: 1.0000 "application " | MEDICATED_PAD | CUTANEOUS | Status: DC | PRN

## 2017-08-07 MED ORDER — ONDANSETRON HCL 4 MG/2ML IJ SOLN: 4.0000 mg | INTRAMUSCULAR | Status: DC | PRN

## 2017-08-07 MED ORDER — IBUPROFEN 600 MG PO TABS
600.0000 mg | ORAL_TABLET | Freq: Four times a day (QID) | ORAL | Status: DC
  Administered 2017-08-07 – 2017-08-08 (×6): 600 mg via ORAL
  Filled 2017-08-07 (×6): qty 1

## 2017-08-07 MED ORDER — SENNOSIDES-DOCUSATE SODIUM 8.6-50 MG PO TABS
2.0000 | ORAL_TABLET | ORAL | Status: DC
  Administered 2017-08-07: 2 via ORAL
  Filled 2017-08-07: qty 2

## 2017-08-07 MED ORDER — BENZOCAINE-MENTHOL 20-0.5 % EX AERO
1.0000 "application " | INHALATION_SPRAY | CUTANEOUS | Status: DC | PRN
  Filled 2017-08-07: qty 56

## 2017-08-07 NOTE — Lactation Note (Signed)
This note was copied from a baby's chart. Lactation Consultation Note  Patient Name: Brandi Jennings Reasons NFAOZ'HToday's Date: 08/07/2017 Reason for consult: Initial assessment   Initial consult with first time mom of 10 hour old infant. Infant with 2 BF for 10-40 minutes, 1 BF attempt, 1 void and 2 stools since birth. LATCH scores 7.   Enc mom to feed infant STS 8-12 x in 24 hours at first feeding cues. Enc mom to hand express before latch and after BF. Mom concerned she does not have enough milk. Discussed colostrum, supply and demand and milk coming to volume. Reviewed normalcy of cluster feedings.   Changed infant diaper and assisted mom with latching him to the right breast in the laid back modified cradle hold. Infant latched easily with a few suckles. Infant lips are flanged. Mom denies pain with latch. Mom giving breathing apace, Enc mom to not pull back or push in on the breast and showed her infant nostrils. Added pillow for arm support. Reviewed feeding STS and awakening techniques at the breast. Reviewed breast massage/compression with feeding.   BF Resources handout and LC Brochure given, mom informed of IP/OP Services, BF Support Groups and LC phone #. Enc mom to call out for feeding assistance as needed. [facility]   Mom reports all questions have been answered at this time. Mom to call out as needed.    Maternal Data Has patient been taught Hand Expression?: Yes Does the patient have breastfeeding experience prior to this delivery?: No  Feeding Feeding Type: Breast Fed Length of feed: 5 min  LATCH Score Latch: Repeated attempts needed to sustain latch, nipple held in mouth throughout feeding, stimulation needed to elicit sucking reflex.  Audible Swallowing: A few with stimulation  Type of Nipple: Everted at rest and after stimulation  Comfort (Breast/Nipple): Soft / non-tender  Hold (Positioning): Assistance needed to correctly position infant at  breast and maintain latch.  LATCH Score: 7  Interventions Interventions: Breast feeding basics reviewed;Support pillows;Assisted with latch;Position options;Skin to skin;Expressed milk;Breast massage;Breast compression;Hand express  Lactation Tools Discussed/Used WIC Program: Yes   Consult Status Consult Status: Follow-up Date: 08/08/17 Follow-up type: In-patient    Silas FloodSharon S Courtne Lighty 08/07/2017, 1:52 PM

## 2017-08-07 NOTE — Progress Notes (Signed)
Patient currently pushing. Doing well. Will likely have a long second stage of labor. Mother with temperature and FHT with fetal tachycardia. Will start antibiotics for Triple I. Category 2 tracing.   Caryl AdaJazma Phelps, DO OB Fellow Center for [facility], [facility]

## 2017-08-07 NOTE — Progress Notes (Signed)
ANTIBIOTIC CONSULT NOTE - INITIAL  Pharmacy Consult for Gentamicin Indication: Triple I  Allergies  Allergen Reactions  . Shrimp [Shellfish Allergy] Itching and Rash    Patient Measurements: Height: 5' (152.4 cm) Weight: 157 lb 4 oz (71.3 kg) IBW/kg (Calculated) : 45.5 kg Adjusted Body Weight: 53 kg  Vital Signs: Temp: 100.6 F (38.1 C) (02/12 0020) Temp Source: Oral (02/12 0020) BP: 125/60 (02/12 0101) Pulse Rate: 101 (02/12 0101) Intake/Output from previous day: 02/11 0701 - 02/12 0700 In: -  Out: 1200 [Urine:1200] Intake/Output from this shift: Total I/O In: -  Out: 700 [Urine:700]  Labs: Recent Labs    08/06/17 0945  WBC 12.4*  HGB 12.3  PLT 163   CrCl cannot be calculated (No order found.). Estimated CrCl to be ~ 90 ml/min using an estimated SCr of 0.7   Microbiology: Recent Results (from the past 720 hour(s))  Strep Gp B NAA     Status: None   Collection Time: 07/16/17 10:00 AM  Result Value Ref Range Status   Strep Gp B NAA Negative Negative Final    Comment: Centers for Disease Control and Prevention (CDC) and American Congress of Obstetricians and Gynecologists (ACOG) guidelines for prevention of perinatal group B streptococcal (GBS) disease specify co-collection of a vaginal and rectal swab specimen to maximize sensitivity of GBS detection. Per the CDC and ACOG, swabbing both the lower vagina and rectum substantially increases the yield of detection compared with sampling the vagina alone. Penicillin G, ampicillin, or cefazolin are indicated for intrapartum prophylaxis of perinatal GBS colonization. Reflex susceptibility testing should be performed prior to use of clindamycin only on GBS isolates from penicillin-allergic women who are considered a high risk for anaphylaxis. Treatment with vancomycin without additional testing is warranted if resistance to clindamycin is noted.     Medical History: History reviewed. No pertinent past medical  history.  Medications:  Anti-infectives (From admission, onward)   Start     Dose/Rate Route Frequency Ordered Stop   08/07/17 0130  gentamicin (GARAMYCIN) 120 mg in dextrose 5 % 50 mL IVPB     120 mg 106 mL/hr over 30 Minutes Intravenous Every 8 hours 08/07/17 0109     08/07/17 0115  clindamycin (CLEOCIN) IVPB 600 mg  Status:  Discontinued     600 mg 100 mL/hr over 30 Minutes Intravenous Every 6 hours 08/07/17 0101 08/07/17 0102   08/07/17 0115  ampicillin (OMNIPEN) 2 g in sodium chloride 0.9 % 100 mL IVPB     2 g 300 mL/hr over 20 Minutes Intravenous Every 6 hours 08/07/17 0102       Assessment: 22 yr old female with fever during late stages of labor.    Goal of Therapy:  Gentamicin peak 6-8 Gentamicin trough <1  Plan:  Gentamicin 120 mg IV Q 8 hr Check SCr if treatment continues after delivery  Natasha BenceCline, Avaiyah Strubel 08/07/2017,1:33 AM

## 2017-08-07 NOTE — Anesthesia Postprocedure Evaluation (Signed)
Anesthesia Post Note  Patient: Brandi Jennings  Procedure(s) Performed: AN AD HOC LABOR EPIDURAL     Patient location during evaluation: Mother Baby Anesthesia Type: Epidural Level of consciousness: awake and alert Pain management: pain level controlled Vital Signs Assessment: post-procedure vital signs reviewed and stable Respiratory status: spontaneous breathing, nonlabored ventilation and respiratory function stable Cardiovascular status: stable Postop Assessment: no headache, no backache and epidural receding Anesthetic complications: no    Last Vitals:  Vitals:   08/07/17 0520 08/07/17 0618  BP: 99/61 119/60  Pulse: 81 78  Resp: 18 18  Temp: 36.6 C 36.8 C  SpO2: 100% 99%    Last Pain:  Vitals:   08/07/17 0618  TempSrc: Oral  PainSc: 0-No pain   Pain Goal: Patients Stated Pain Goal: 6 (08/06/17 1221)               Marrion CoyMERRITT,Lavida Patch

## 2017-08-08 MED ORDER — IBUPROFEN 600 MG PO TABS: 600.0000 mg | ORAL_TABLET | Freq: Four times a day (QID) | ORAL | 0 refills | Status: AC

## 2017-08-08 NOTE — Discharge Summary (Signed)
OB Discharge Summary     Patient Name: Brandi Jennings DOB: September 04, 1995 MRN: [id number]  Date of admission: 08/06/2017 Delivering MD: Pincus LargePHELPS, JAZMA Y   Date of discharge: 08/08/2017  Admitting diagnosis: 39WKS, WATER BROKE POSSIBLY Intrauterine pregnancy: 9048w1d     Secondary diagnosis:  Active Problems:   Labor and delivery, indication for care   SVD (spontaneous vaginal delivery)  Additional problems: Labial swelling PP     Discharge diagnosis: Term Pregnancy Delivered                                                                                                Post partum procedures:none  Augmentation: Pitocin  Complications: None  Hospital course:  Onset of Labor With Vaginal Delivery     [age] y.o. yo G1P1001 at 3248w1d was admitted in Active Labor on 08/06/2017. Patient had an uncomplicated labor course as follows:  Membrane Rupture Time/Date: 8:00 AM ,08/06/2017   Intrapartum Procedures: Episiotomy: None [1]                                         Lacerations:  2nd degree [3];Labial [10]  Patient had a delivery of a Viable infant. 08/07/2017  Information for the patient's newborn:  Vanetta ShawlKim, Boy Deion [[id number]][[id number]]  Delivery Method: Vaginal, Spontaneous(Filed from Delivery Summary)    Pateint had an uncomplicated postpartum course.  She is ambulating, tolerating a regular diet, passing flatus, and urinating well. Patient is discharged home in stable condition on 08/08/17.   Physical exam  Vitals:   08/07/17 0618 08/07/17 1000 08/07/17 1833 08/08/17 0532  BP: 119/60 (!) 120/57 (!) 104/58 106/60  Pulse: 78 83 81 80  Resp: 18 16 16 16   Temp: 98.2 F (36.8 C) 98.6 F (37 C) 98.7 F (37.1 C) 98.6 F (37 C)  TempSrc: Oral Oral Oral Oral  SpO2: 99% 100% 99%   Weight:      Height:       General: alert, cooperative and no distress Lochia: appropriate Uterine Fundus: firm Incision: N/A DVT Evaluation: No evidence of DVT seen on physical exam. Labs: Lab Results  Component Value  Date   WBC 12.4 (H) 08/06/2017   HGB 12.3 08/06/2017   HCT 35.6 (L) 08/06/2017   MCV 88.6 08/06/2017   PLT 163 08/06/2017   No flowsheet data found.  Discharge instruction: per After Visit Summary and "Baby and Me Booklet".  After visit meds:  Allergies as of 08/08/2017      Reactions   Shrimp [shellfish Allergy] Itching, Rash      Medication List    TAKE these medications   CITRANATAL BLOOM 90-1 MG Tabs Take 1 tablet by mouth daily.   ibuprofen 600 MG tablet Commonly known as:  ADVIL,MOTRIN Take 1 tablet (600 mg total) by mouth every 6 (six) hours.   Vitamin D (Ergocalciferol) 50000 units Caps capsule Commonly known as:  DRISDOL Take 1 capsule (50,000 Units total) by mouth every 7 (seven) days.       Diet:  routine diet  Activity: Advance as tolerated. Pelvic rest for 6 weeks.   Outpatient follow up:6 weeks Follow up Appt:No future appointments. Follow up Visit:No Follow-up on file.  Postpartum contraception: Was interested in Depo but discussed LARCs as most effective forms of birth control today at discharge and pt interested in IUD vs Nexplanon at [facility] visit.  Newborn Data: Live born female  Birth Weight: 8 lb 3.2 oz (3720 g) APGAR: 9, 9  Newborn Delivery   Birth date/time:  08/07/2017 03:02:00 Delivery type:  Vaginal, Spontaneous     Baby Feeding: Breast Disposition:home with mother   08/08/2017 Sharen Counter, CNM

## 2017-08-08 NOTE — Progress Notes (Signed)
Post Partum Day 1 Subjective: no complaints, up ad lib and tolerating PO. Complains of "swelling in my vagina"  Objective: Blood pressure 106/60, pulse 80, temperature 98.6 F (37 C), temperature source Oral, resp. rate 16, height 5' (1.524 m), weight 71.3 kg (157 lb 4 oz), last menstrual period 11/06/2016, SpO2 99 %, unknown if currently breastfeeding.  Physical Exam:  General: alert, cooperative and no distress Lochia: appropriate Uterine Fundus: firm DVT Evaluation: No evidence of DVT seen on physical exam. No cords or calf tenderness. No significant calf/ankle edema. GU: significant vulvar edema, tender to palpation, erythematous   Recent Labs    08/06/17 0945  HGB 12.3  HCT 35.6*    Assessment/Plan: Plan for discharge tomorrow and Contraception depo. both breast and bottle feeding. Will continue to ice labial area and monitor UOP   LOS: 2 days   Oralia ManisSherin Aireanna Luellen, DO PGY-1 08/08/2017, 9:05 AM

## 2017-08-08 NOTE — Discharge Instructions (Signed)
Try Padsicles for your vaginal swelling. Here is one website with recipe but you can try others.  Essential oils are optional.  Mainly try witchhazel and aloe vera gel in maternity/maxi pads.     [email or web address].[email or web address]_soothe-your-[email or web address]-lady-bits-[email or web address]-these-diy-padsicles_20001594.bc

## 2017-09-05 ENCOUNTER — Encounter: Payer: Self-pay | Admitting: Certified Nurse Midwife

## 2017-09-05 ENCOUNTER — Ambulatory Visit (INDEPENDENT_AMBULATORY_CARE_PROVIDER_SITE_OTHER): Payer: Medicaid Other | Admitting: Certified Nurse Midwife

## 2017-09-05 DIAGNOSIS — Z1389 Encounter for screening for other disorder: Secondary | ICD-10-CM

## 2017-09-05 DIAGNOSIS — K641 Second degree hemorrhoids: Secondary | ICD-10-CM

## 2017-09-05 MED ORDER — HYDROCORTISONE ACETATE 25 MG RE SUPP: 25.0000 mg | Freq: Two times a day (BID) | RECTAL | 4 refills | Status: AC

## 2017-09-05 NOTE — Progress Notes (Signed)
Needs refills on Ibuprofen. Hemorrhoids hurt and has been constipated x 7 days.  UPT today is NEGATIVE. She is not sure that she wants the Nexplanon anymore.

## 2017-09-05 NOTE — Patient Instructions (Addendum)
Nonsurgical Procedures for Hemorrhoids, Care After Refer to this sheet in the next few weeks. These instructions provide you with information about caring for yourself after your procedure. Your health care provider may also give you more specific instructions. Your treatment has been planned according to current medical practices, but problems sometimes occur. Call your health care provider if you have any problems or questions after your procedure. What can I expect after the procedure? After the procedure, it is common to have:  Slight rectal bleeding for a few days.  Soreness or a dull ache in the rectal area.  Follow these instructions at home: Medicines   Take over-the-counter and prescription medicines only as told by your health care provider.  Use a stool softener or a bulk laxative as told by your health care provider. Activity  Return to your normal activities as told by your health care provider. Ask your health care provider what activities are safe for you.  Do not lift anything that is heavier than 10 lb (4.5 kg).  Do not sit for long periods of time. Take a walk every day or as told by your health care provider.  Do not strain to have a bowel movement.  Do not spend a long time sitting on the toilet. Eating and drinking   Eat foods that contain fiber, such as whole grains, beans, nuts, fruits, and vegetables.  Drink enough fluid to keep your urine clear or pale yellow. General instructions  Sit in a warm bath 2-3 times a day to relieve soreness or itching.  Keep all follow-up visits as told by your health care provider. This is important. Contact a health care provider if:  Your pain medicine is not helping.  You have a fever.  You become constipated.  You continue to have light rectal bleeding for more than a few days. Get help right away if:  You have very bad rectal pain.  You have heavy bleeding from your rectum. This information is not intended  to replace advice given to you by your health care provider. Make sure you discuss any questions you have with your health care provider. Document Released: 07/09/2015 Document Revised: 11/18/2015 Document Reviewed: 09/07/2014 Elsevier Interactive Patient Education  2018 [employer]. Surgical Procedures for Hemorrhoids Surgical procedures can be used to treat hemorrhoids. Hemorrhoids are swollen veins that are inside the rectum (internal hemorrhoids) or around the anus (external hemorrhoids). They are caused by increased pressure in the anal area. This pressure may result from straining to have a bowel movement (constipation), diarrhea, pregnancy, obesity, anal sex, or sitting for long periods of time. Hemorrhoids can cause symptoms such as pain and bleeding. Surgery may be needed if diet changes, lifestyle changes, and other treatments do not help your symptoms. Various surgical methods may be used. Three common methods are:  Closed hemorrhoidectomy. The hemorrhoids are surgically removed, and the surgical cuts (incisions) are closed with stitches (sutures).  Open hemorrhoidectomy. The hemorrhoids are surgically removed, but the incisions are allowed to heal without sutures.  Stapled hemorrhoidopexy. The hemorrhoids are removed using a device that takes out a ring of excess tissue.  Tell a health care provider about:  Any allergies you have.  All medicines you are taking, including vitamins, herbs, eye drops, creams, and over-the-counter medicines.  Any problems you or family members have had with anesthetic medicines.  Any blood disorders you have.  Any surgeries you have had.  Any medical conditions you have.  Whether you are pregnant or  may be pregnant. What are the risks? Generally, this is a safe procedure. However, problems may occur, including:  Infection.  Bleeding.  Allergic reactions to medicines.  Damage to other structures or  organs.  Pain.  Constipation.  Difficulty passing urine.  Narrowing of the anal canal (stenosis).  Difficulty controlling bowel movements (incontinence).  What happens before the procedure?  Ask your health care provider about: ? Changing or stopping your regular medicines. This is especially important if you are taking diabetes medicines or blood thinners. ? Taking medicines such as aspirin and ibuprofen. These medicines can thin your blood. Do not take these medicines before your procedure if your health care provider instructs you not to.  You may need to have a procedure to examine the inside of your colon with a scope (colonoscopy). Your health care provider may do this to make sure that there are no other causes for your bleeding or pain.  Follow instructions from your health care provider about eating or drinking restrictions.  You may be instructed to take a laxative and an enema to clean out your colon before surgery (bowel prep). Carefully follow instructions from your health care provider about bowel prep.  Ask your health care provider how your surgical site will be marked or identified.  You may be given antibiotic medicine to help prevent infection.  Plan to have someone take you home after the procedure. What happens during the procedure?  To reduce your risk of infection: ? Your health care team will wash or sanitize their hands. ? Your skin will be washed with soap.  An IV tube will be inserted into one of your veins.  You will be given one or more of the following: ? A medicine to help you relax (sedative). ? A medicine to numb the area (local anesthetic). ? A medicine to make you fall asleep (general anesthetic). ? A medicine that is injected into an area of your body to numb everything below the injection site (regional anesthetic).  A lubricating jelly may be placed into your rectum.  Your surgeon will insert a short scope (anoscope) into your rectum  to examine the hemorrhoids.  One of the following hemorrhoid procedures will be performed. Closed Hemorrhoidectomy  Your surgeon will use surgical instruments to open the tissue around the hemorrhoids.  The veins that supply the hemorrhoids will be tied off with a suture.  The hemorrhoids will be removed.  The tissue that surrounds the hemorrhoids will be closed with sutures that your body can absorb (absorbable sutures). Open Hemorrhoidectomy  The hemorrhoids will be removed with surgical instruments.  The incisions will be left open to heal without sutures. Stapled Hemorrhoidopexy  Your surgeon will use a circular stapling device to remove the hemorrhoids.  The device will be inserted into your anus. It will remove a circular ring of tissue that includes hemorrhoid tissue and some tissue above the hemorrhoids.  The staples in the device will close the edges of removed tissue. This will cut off the blood supply to the hemorrhoids and will pull any remaining hemorrhoids back into place. Each of these procedures may vary among health care providers and hospitals. What happens after the procedure?  Your blood pressure, heart rate, breathing rate, and blood oxygen level will be monitored often until the medicines you were given have worn off.  You will be given pain medicine as needed. This information is not intended to replace advice given to you by your health care provider. Make  sure you discuss any questions you have with your health care provider. Document Released: 04/09/2009 Document Revised: 11/18/2015 Document Reviewed: 09/07/2014 Elsevier Interactive Patient Education  2018 Else[employer] Inc.  High-Fiber Diet Fiber, also called dietary fiber, is a type of carbohydrate found in fruits, vegetables, whole grains, and beans. A high-fiber diet can have many health benefits. Your health care provider may recommend a high-fiber diet to help:  Prevent constipation. Fiber can make  your bowel movements more regular.  Lower your cholesterol.  Relieve hemorrhoids, uncomplicated diverticulosis, or irritable bowel syndrome.  Prevent overeating as part of a weight-loss plan.  Prevent heart disease, type 2 diabetes, and certain cancers.  What is my plan? The recommended daily intake of fiber includes:  38 grams for men under age 51.  30 grams for men over age 51.  25 grams for women under age 36.  21 grams for women over age 71.  You can get the recommended daily intake of dietary fiber by eating a variety of fruits, vegetables, grains, and beans. Your health care provider may also recommend a fiber supplement if it is not possible to get enough fiber through your diet. What do I need to know about a high-fiber diet?  Fiber supplements have not been widely studied for their effectiveness, so it is better to get fiber through food sources.  Always check the fiber content on thenutrition facts label of any prepackaged food. Look for foods that contain at least 5 grams of fiber per serving.  Ask your dietitian if you have questions about specific foods that are related to your condition, especially if those foods are not listed in the following section.  Increase your daily fiber consumption gradually. Increasing your intake of dietary fiber too quickly may cause bloating, cramping, or gas.  Drink plenty of water. Water helps you to digest fiber. What foods can I eat? Grains Whole-grain breads. Multigrain cereal. Oats and oatmeal. Brown rice. Barley. Bulgur wheat. Millet. Bran muffins. Popcorn. Rye wafer crackers. Vegetables Sweet potatoes. Spinach. Kale. Artichokes. Cabbage. Broccoli. Green peas. Carrots. Squash. Fruits Berries. Pears. Apples. Oranges. Avocados. Prunes and raisins. Dried figs. Meats and Other Protein Sources Navy, kidney, pinto, and soy beans. Split peas. Lentils. Nuts and seeds. Dairy Fiber-fortified yogurt. Beverages Fiber-fortified soy  milk. Fiber-fortified orange juice. Other Fiber bars. The items listed above may not be a complete list of recommended foods or beverages. Contact your dietitian for more options. What foods are not recommended? Grains White bread. Pasta made with refined flour. White rice. Vegetables Fried potatoes. Canned vegetables. Well-cooked vegetables. Fruits Fruit juice. Cooked, strained fruit. Meats and Other Protein Sources Fatty cuts of meat. Fried Environmental education officer or fried fish. Dairy Milk. Yogurt. Cream cheese. Sour cream. Beverages Soft drinks. Other Cakes and pastries. Butter and oils. The items listed above may not be a complete list of foods and beverages to avoid. Contact your dietitian for more information. What are some tips for including high-fiber foods in my diet?  Eat a wide variety of high-fiber foods.  Make sure that half of all grains consumed each day are whole grains.  Replace breads and cereals made from refined flour or white flour with whole-grain breads and cereals.  Replace white rice with brown rice, bulgur wheat, or millet.  Start the day with a breakfast that is high in fiber, such as a cereal that contains at least 5 grams of fiber per serving.  Use beans in place of meat in soups, salads, or pasta.  Eat  high-fiber snacks, such as berries, raw vegetables, nuts, or popcorn. This information is not intended to replace advice given to you by your health care provider. Make sure you discuss any questions you have with your health care provider. Document Released: 06/12/2005 Document Revised: 11/18/2015 Document Reviewed: 11/25/2013 Elsevier Interactive Patient Education  2018 [employer].  Hemorrhoids Hemorrhoids are swollen veins in and around the rectum or anus. There are two types of hemorrhoids:  Internal hemorrhoids. These occur in the veins that are just inside the rectum. They may poke through to the outside and become irritated and painful.  External  hemorrhoids. These occur in the veins that are outside of the anus and can be felt as a painful swelling or hard lump near the anus.  Most hemorrhoids do not cause serious problems, and they can be managed with home treatments such as diet and lifestyle changes. If home treatments do not help your symptoms, procedures can be done to shrink or remove the hemorrhoids. What are the causes? This condition is caused by increased pressure in the anal area. This pressure may result from various things, including:  Constipation.  Straining to have a bowel movement.  Diarrhea.  Pregnancy.  Obesity.  Sitting for long periods of time.  Heavy lifting or other activity that causes you to strain.  Anal sex.  What are the signs or symptoms? Symptoms of this condition include:  Pain.  Anal itching or irritation.  Rectal bleeding.  Leakage of stool (feces).  Anal swelling.  One or more lumps around the anus.  How is this diagnosed? This condition can often be diagnosed through a visual exam. Other exams or tests may also be done, such as:  Examination of the rectal area with a gloved hand (digital rectal exam).  Examination of the anal canal using a small tube (anoscope).  A blood test, if you have lost a significant amount of blood.  A test to look inside the colon (sigmoidoscopy or colonoscopy).  How is this treated? This condition can usually be treated at home. However, various procedures may be done if dietary changes, lifestyle changes, and other home treatments do not help your symptoms. These procedures can help make the hemorrhoids smaller or remove them completely. Some of these procedures involve surgery, and others do not. Common procedures include:  Rubber band ligation. Rubber bands are placed at the base of the hemorrhoids to cut off the blood supply to them.  Sclerotherapy. Medicine is injected into the hemorrhoids to shrink them.  Infrared coagulation. A type of  light energy is used to get rid of the hemorrhoids.  Hemorrhoidectomy surgery. The hemorrhoids are surgically removed, and the veins that supply them are tied off.  Stapled hemorrhoidopexy surgery. A circular stapling device is used to remove the hemorrhoids and use staples to cut off the blood supply to them.  Follow these instructions at home: Eating and drinking  Eat foods that have a lot of fiber in them, such as whole grains, beans, nuts, fruits, and vegetables. Ask your health care provider about taking products that have added fiber (fiber supplements).  Drink enough fluid to keep your urine clear or pale yellow. Managing pain and swelling  Take warm sitz baths for 20 minutes, 3-4 times a day to ease pain and discomfort.  If directed, apply ice to the affected area. Using ice packs between sitz baths may be helpful. ? Put ice in a plastic bag. ? Place a towel between your skin and the  bag. ? Leave the ice on for 20 minutes, 2-3 times a day. General instructions  Take over-the-counter and prescription medicines only as told by your health care provider.  Use medicated creams or suppositories as told.  Exercise regularly.  Go to the bathroom when you have the urge to have a bowel movement. Do not wait.  Avoid straining to have bowel movements.  Keep the anal area dry and clean. Use wet toilet paper or moist towelettes after a bowel movement.  Do not sit on the toilet for long periods of time. This increases blood pooling and pain. Contact a health care provider if:  You have increasing pain and swelling that are not controlled by treatment or medicine.  You have uncontrolled bleeding.  You have difficulty having a bowel movement, or you are unable to have a bowel movement.  You have pain or inflammation outside the area of the hemorrhoids. This information is not intended to replace advice given to you by your health care provider. Make sure you discuss any questions  you have with your health care provider. Document Released: 06/09/2000 Document Revised: 11/10/2015 Document Reviewed: 02/24/2015 Elsevier Interactive Patient Education  [employer]2018 Elsevier Inc.

## 2017-09-05 NOTE — Progress Notes (Signed)
   Subjective:     Brandi Jennings is a 22 y.o. female who presents for a postpartum visit. She is 4 weeks postpartum following a spontaneous vaginal delivery. I have fully reviewed the prenatal and intrapartum course. The delivery was at 39.1 gestational weeks. Outcome: spontaneous vaginal delivery. Anesthesia: epidural. Postpartum course has been UNREMARKABLE. Baby's course has been UNREMARKABLE. Baby is feeding by bottle Rush Barer- Gerber. Bleeding staining only. Bowel function is Constipated x 7 days. Bladder function is normal. Patient is not sexually active. Contraception method is none. Postpartum depression screening: negative.  Has picture of hemorrhoid, declines exam.   Declines birth control today, wants to discuss it with her sister.    The following portions of the patient's history were reviewed and updated as appropriate: allergies, current medications, past family history, past medical history, past social history, past surgical history and problem list.  Review of Systems Pertinent items noted in HPI and remainder of comprehensive ROS otherwise negative.   Objective:    BP 111/76   Pulse 77   Ht 5\' 1"  (1.549 m)   Wt 135 lb 12.8 oz (61.6 kg)   LMP 11/06/2016   Breastfeeding? No   BMI 25.66 kg/m   General:  alert, cooperative and no distress   Breasts:  inspection negative, no nipple discharge or bleeding, no masses or nodularity palpable  Lungs: clear to auscultation bilaterally  Heart:  regular rate and rhythm, S1, S2 normal, no murmur, click, rub or gallop  Abdomen: soft, non-tender; bowel sounds normal; no masses,  no organomegaly  Pelvic/Rectal Exam: Not performed.       Pap Smear: 02/01/17: Normal  Assessment:    Normal 4 week postpartum exam. Pap smear not done at today's visit.  Negative UPT  Constipation: OTC medications discussed  Hemorrhoid: Rx given  Plan:    1. Contraception: abstinence 2.  OTC medications for hemorrhoid and constipation discussed.  3. Follow up in: 6  months for annual exam. PRN for birth control discussed.

## 2017-10-13 IMAGING — US US MFM OB COMP +14 WKS
1 series · 14 of 28 positions shown · non-contrast
Comparison: none

[Series 1: us mfm ob comp +14 wks · 14 of 80 slices shown]
[im 3/80]
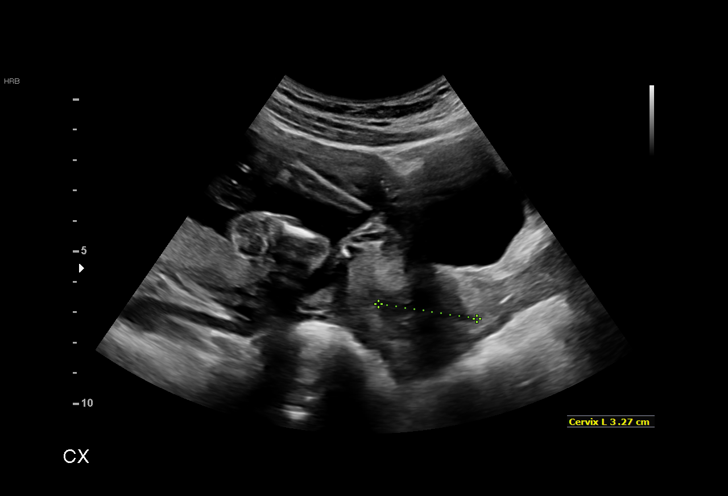
[im 9/80]
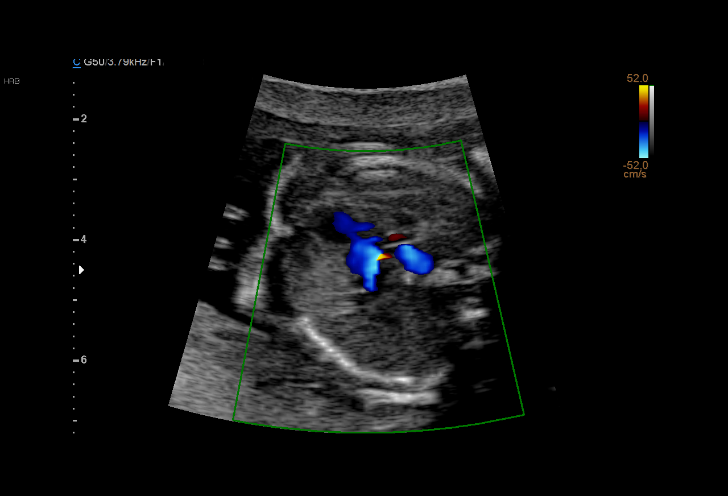
[im 15/80]
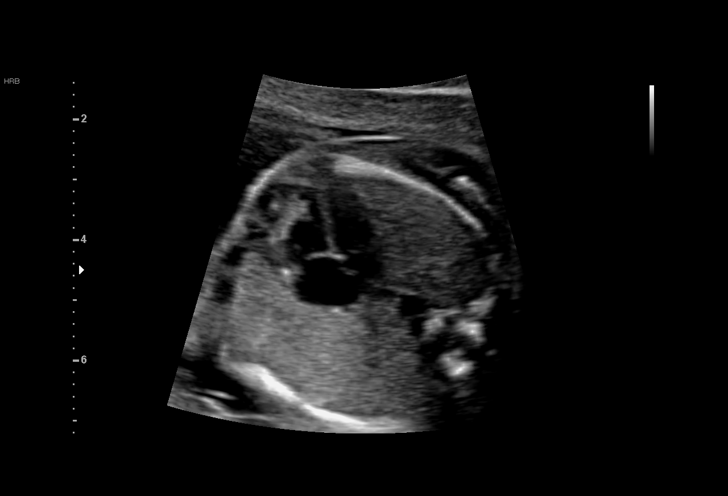
[im 21/80]
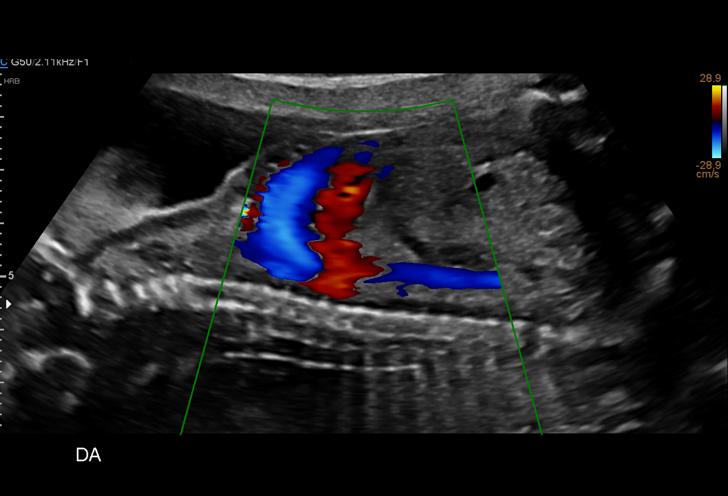
[im 27/80]
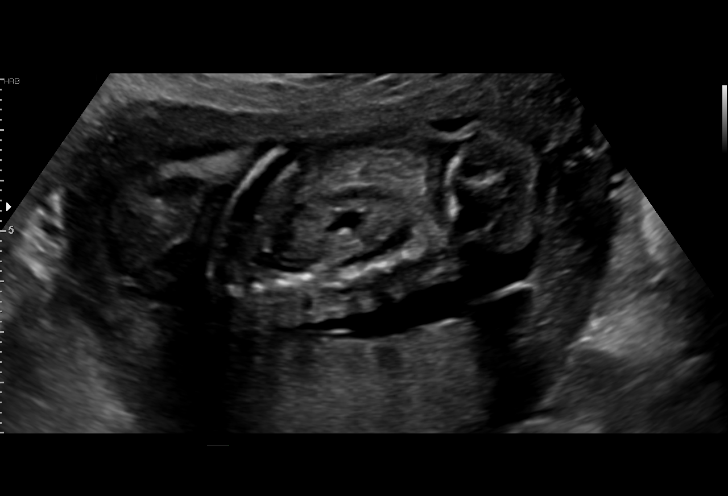
[im 33/80]
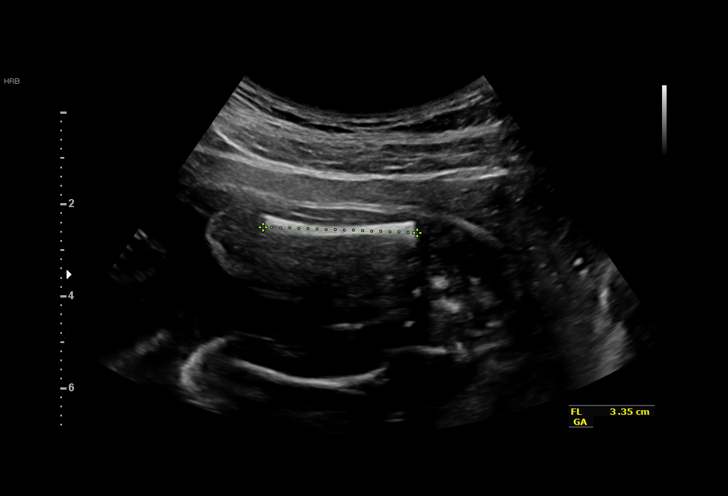
[im 39/80]
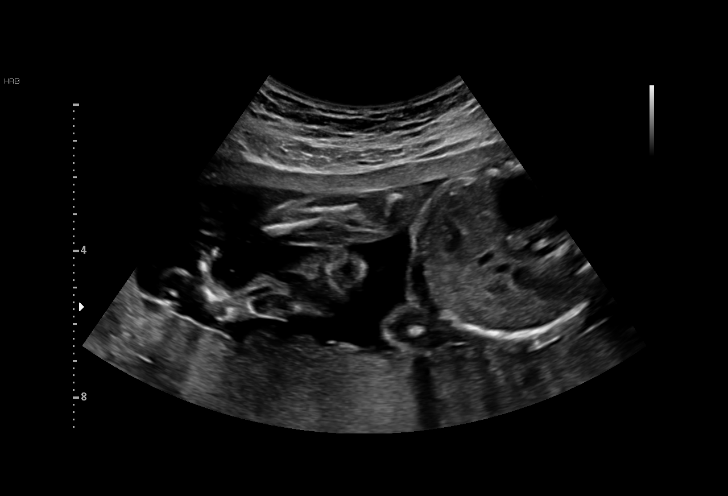
[im 44/80]
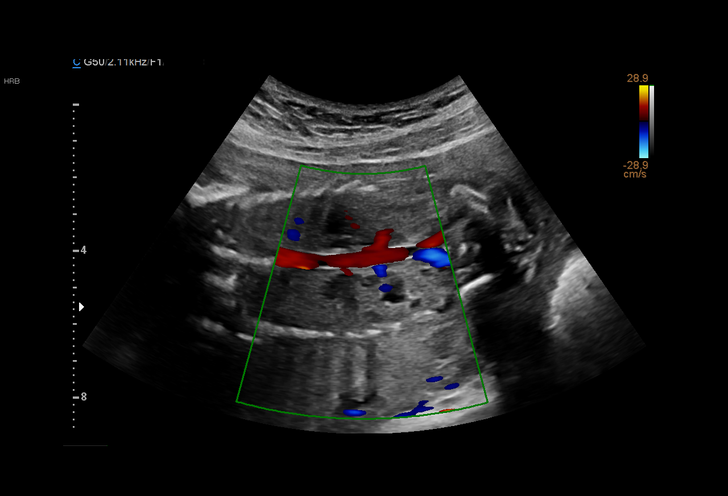
[im 50/80]
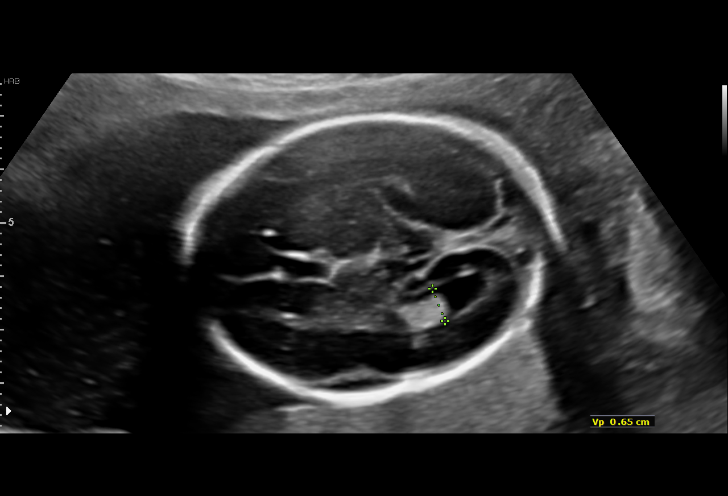
[im 56/80]
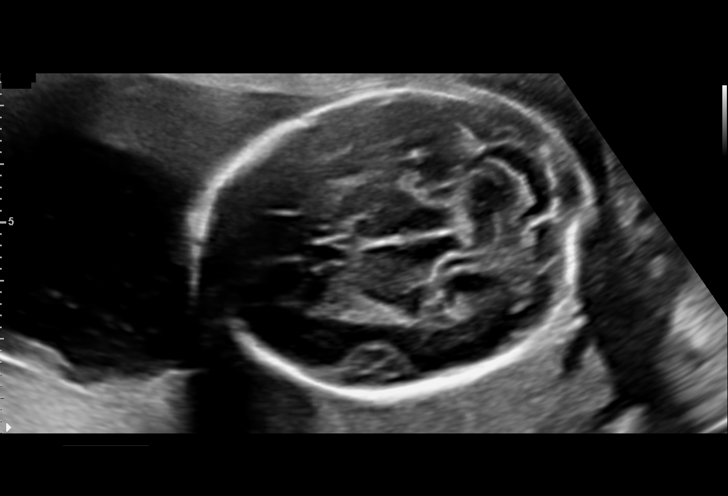
[im 62/80]
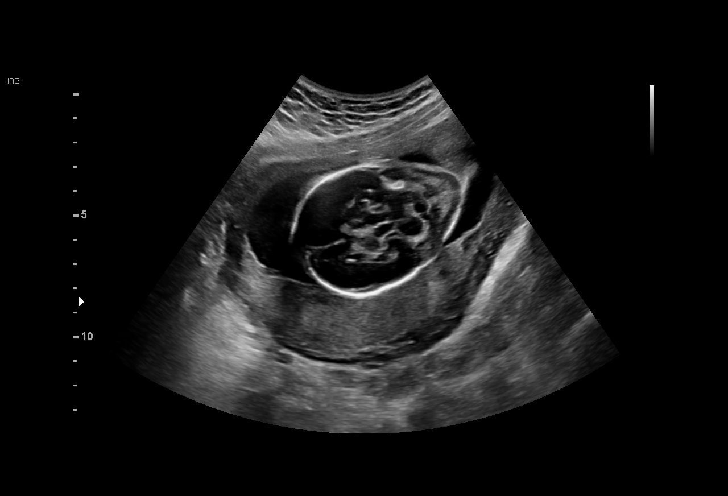
[im 68/80]
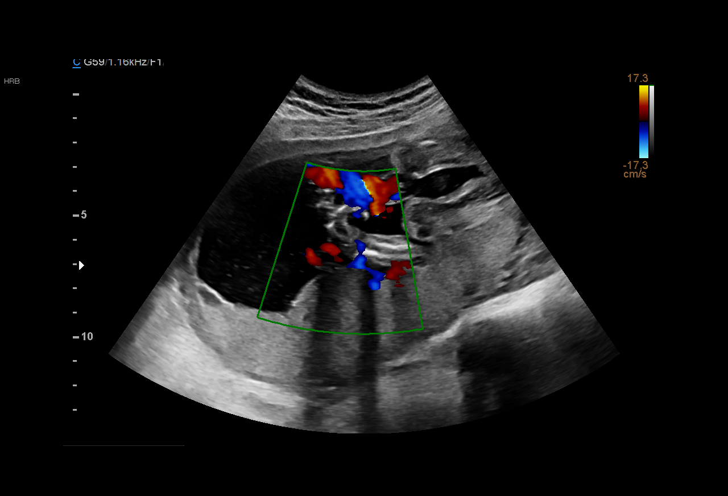
[im 74/80]
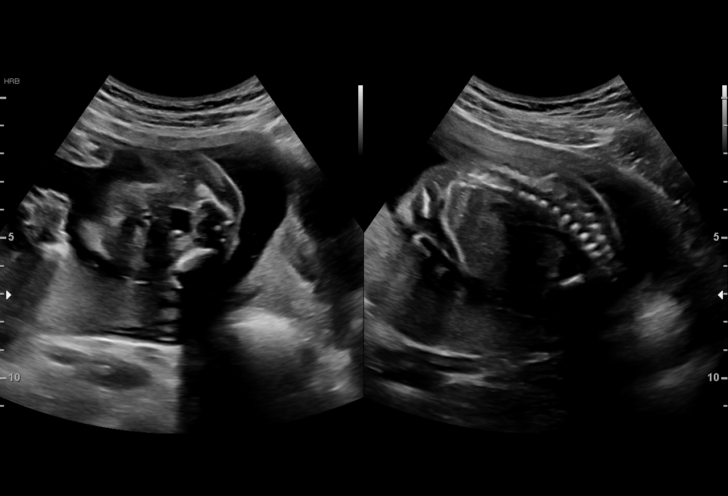
[im 80/80]
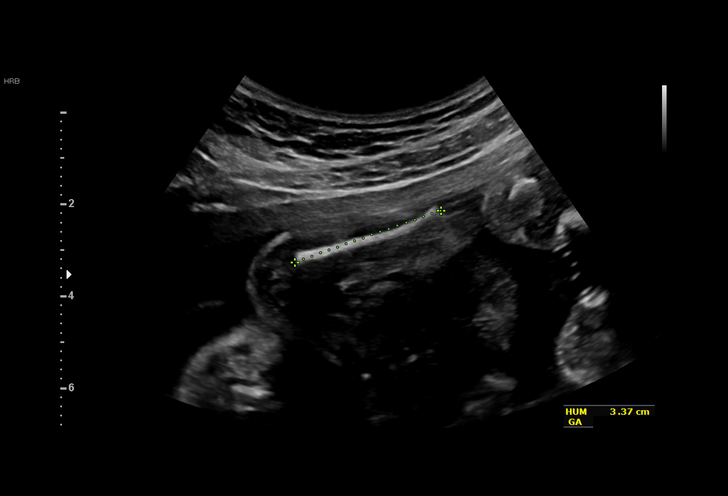

[14 of 28 positions shown; findings below may reference images not displayed]

Road [HOSPITAL]

Indications

21 weeks gestation of pregnancy
Encounter for fetal anatomic survey
OB History

Blood Type:            Height:  5'1"   Weight (lb):  125       BMI:
Gravidity:    1
Fetal Evaluation

Num Of Fetuses:     1
Fetal Heart         133
Rate(bpm):
Cardiac Activity:   Observed
Presentation:       Breech
Placenta:           Posterior, above cervical os
P. Cord Insertion:  Visualized, central

Amniotic Fluid
AFI FV:      Subjectively within normal limits

Largest Pocket(cm)
4.7
Biometry

BPD:      51.6  mm     G. Age:  21w 5d         69  %    CI:        67.86   %    70 - 86
FL/HC:      16.9   %    15.9 -
HC:      200.4  mm     G. Age:  22w 2d         82  %    HC/AC:      1.12        1.06 -
AC:      178.2  mm     G. Age:  22w 5d         87  %    FL/BPD:     65.5   %
FL:       33.8  mm     G. Age:  20w 4d         23  %    FL/AC:      19.0   %    20 - 24
HUM:      33.7  mm     G. Age:  21w 3d         55  %
CER:        23  mm     G. Age:  21w 4d         61  %
CM:          5  mm

Est. FW:     452  gm           1 lb     55  %
Gestational Age

LMP:           21w 1d        Date:  11/06/16                 EDD:   08/13/17
U/S Today:     21w 6d                                        EDD:   08/08/17
Best:          21w 1d     Det. By:  LMP  (11/06/16)          EDD:   08/13/17
Anatomy

Cranium:               Appears normal         Aortic Arch:            Appears normal
Cavum:                 Appears normal         Ductal Arch:            Appears normal
Ventricles:            Appears normal         Diaphragm:              Appears normal
Choroid Plexus:        Appears normal         Stomach:                Appears normal, left
sided
Cerebellum:            Appears normal         Abdomen:                Appears normal
Posterior Fossa:       Appears normal         Abdominal Wall:         Appears nml (cord
insert, abd wall)
Nuchal Fold:           Not applicable (>20    Cord Vessels:           Appears normal (3
wks GA)                                        vessel cord)
Face:                  Appears normal         Kidneys:                Appear normal
(orbits and profile)
Lips:                  Appears normal         Bladder:                Appears normal
Thoracic:              Appears normal         Spine:                  Appears normal
Heart:                 Appears normal         Upper Extremities:      Appears normal
(4CH, axis, and situs
RVOT:                  Appears normal         Lower Extremities:      Appears normal
LVOT:                  Appears normal

Other:  Male gender. Heels and 5th digit visualized.
Cervix Uterus Adnexa

Cervix
Length:           3.27  cm.
Normal appearance by transabdominal scan.

Uterus
No abnormality visualized.
Impression

Singleton intrauterine pregnancy at 21 weeks 1 day gestation
with fetal cardiac activity
Breech presentation
Posterior placenta without evidence of previa
Normal appearing fetal growth and amniotic fluid
No apparent birth defects noted on fetal anatomic survey
Normal appearing cervical length
Recommendations

Follow-up ultrasounds as clinically indicated.

## 2017-11-06 ENCOUNTER — Ambulatory Visit (INDEPENDENT_AMBULATORY_CARE_PROVIDER_SITE_OTHER): Payer: Medicaid Other | Admitting: Certified Nurse Midwife

## 2017-11-06 ENCOUNTER — Encounter: Payer: Self-pay | Admitting: Certified Nurse Midwife

## 2017-11-06 VITALS — BP 106/67 | HR 95 | Wt 130.0 lb

## 2017-11-06 DIAGNOSIS — Z30430 Encounter for insertion of intrauterine contraceptive device: Secondary | ICD-10-CM

## 2017-11-06 DIAGNOSIS — Z3202 Encounter for pregnancy test, result negative: Secondary | ICD-10-CM

## 2017-11-06 LAB — POCT URINE PREGNANCY: PREG TEST UR: NEGATIVE

## 2017-11-06 MED ORDER — LEVONORGESTREL 20 MCG/24HR IU IUD
INTRAUTERINE_SYSTEM | Freq: Once | INTRAUTERINE | Status: AC
  Administered 2017-11-06: 1 via INTRAUTERINE

## 2017-11-06 NOTE — Progress Notes (Signed)
RGYN patient presents for IUD insertion today .  Denies any unprotected intercourse x 14 days.  LMP: 11/06/17 per pt   UPT: NEG

## 2017-11-07 DIAGNOSIS — Z30430 Encounter for insertion of intrauterine contraceptive device: Secondary | ICD-10-CM | POA: Insufficient documentation

## 2017-11-07 NOTE — Progress Notes (Signed)
IUD Procedure Note   DIAGNOSIS: Desires long-term, reversible contraception   PROCEDURE: IUD placement Performing Provider: Orvilla Cornwall CNM  Patient counseled prior to procedure. I explained risks and benefits of Mirena IUD, reviewed alternative forms of contraception. Patient stated understanding and consented to continue with procedure.   LMP: 11/06/17 Pregnancy Test: Negative Lot #: [id number] Expiration Date:  Oct 2021   IUD type:  Mirena   [  ] Paragard  [   ] Candise Che   [  ]  Kyleena  PROCEDURE:  Timeout procedure was performed to ensure right patient and right site.  A bimanual exam was performed to determine the position of the uterus, retroverted. The speculum was placed. The vagina and cervix was sterilized in the usual manner and sterile technique was maintained throughout the course of the procedure. A single toothed tenaculum was applied to the posterior lip of the cervix and gentle traction applied. The depth of the uterus was sounded to 9 cm. With gentle traction on the tenaculum, the IUD was inserted to the appropriate depth and inserted without difficulty, after patient relaxed.  The string was cut to an estimated 4 cm length. Bleeding was minimal. The patient tolerated the procedure well.   Follow up: The patient tolerated the procedure well without complications.  Standard post-procedure care is explained and return precautions are given.  Orvilla Cornwall CNM

## 2017-12-06 ENCOUNTER — Encounter: Payer: Self-pay | Admitting: *Deleted

## 2017-12-10 ENCOUNTER — Ambulatory Visit: Payer: Medicaid Other | Admitting: Certified Nurse Midwife
